# Patient Record
Sex: Female | Born: 1985 | Race: Black or African American | Hispanic: No | Marital: Married | Smoking: Never smoker
Health system: Southern US, Community
[De-identification: ages and names within clinical notes are randomized; demographics above are authoritative.]

## PROBLEM LIST (undated history)

## (undated) DIAGNOSIS — Z789 Other specified health status: Secondary | ICD-10-CM

## (undated) HISTORY — PX: TONSILLECTOMY: SUR1361

---

## 2011-02-01 ENCOUNTER — Emergency Department (HOSPITAL_COMMUNITY)
Admission: EM | Admit: 2011-02-01 | Discharge: 2011-02-01 | Disposition: A | Discharge status: Home / Self Care | Payer: BC Managed Care – PPO | Attending: Emergency Medicine | Admitting: Emergency Medicine

## 2011-02-01 DIAGNOSIS — F411 Generalized anxiety disorder: Secondary | ICD-10-CM | POA: Insufficient documentation

## 2011-02-01 DIAGNOSIS — S335XXA Sprain of ligaments of lumbar spine, initial encounter: Secondary | ICD-10-CM | POA: Insufficient documentation

## 2011-02-01 DIAGNOSIS — M545 Low back pain, unspecified: Secondary | ICD-10-CM | POA: Insufficient documentation

## 2011-02-01 DIAGNOSIS — M25569 Pain in unspecified knee: Secondary | ICD-10-CM | POA: Insufficient documentation

## 2011-02-01 DIAGNOSIS — S8000XA Contusion of unspecified knee, initial encounter: Secondary | ICD-10-CM | POA: Insufficient documentation

## 2013-01-16 ENCOUNTER — Other Ambulatory Visit (INDEPENDENT_AMBULATORY_CARE_PROVIDER_SITE_OTHER): Payer: BC Managed Care – PPO

## 2013-01-16 ENCOUNTER — Encounter (INDEPENDENT_AMBULATORY_CARE_PROVIDER_SITE_OTHER): Payer: BC Managed Care – PPO | Admitting: Obstetrics

## 2013-01-16 DIAGNOSIS — Z3201 Encounter for pregnancy test, result positive: Secondary | ICD-10-CM

## 2013-01-16 NOTE — Progress Notes (Unsigned)
Pt in office for a confirmation of pregnancy. Pt states she had a positive home pregnancy test. Pregnancy test in office is positive. LMP- 12-15-12 with EDD- 09-22-12   Pt advised to schedule a NOB appointment. Pt advised to start PNV. Pt states she will get OTC PNV.

## 2013-02-01 ENCOUNTER — Inpatient Hospital Stay (HOSPITAL_COMMUNITY)
Admission: AD | Admit: 2013-02-01 | Discharge: 2013-02-02 | Disposition: A | Discharge status: Home / Self Care | Payer: BC Managed Care – PPO | Source: Ambulatory Visit | Attending: Obstetrics & Gynecology | Admitting: Obstetrics & Gynecology

## 2013-02-01 ENCOUNTER — Encounter (HOSPITAL_COMMUNITY): Payer: Self-pay

## 2013-02-01 DIAGNOSIS — O200 Threatened abortion: Secondary | ICD-10-CM

## 2013-02-01 DIAGNOSIS — R109 Unspecified abdominal pain: Secondary | ICD-10-CM | POA: Insufficient documentation

## 2013-02-01 HISTORY — DX: Other specified health status: Z78.9

## 2013-02-01 LAB — CBC
HCT: 34.5 % — ABNORMAL LOW (ref 36.0–46.0)
Hemoglobin: 11.8 g/dL — ABNORMAL LOW (ref 12.0–15.0)
MCH: 29 pg (ref 26.0–34.0)
MCHC: 34.2 g/dL (ref 30.0–36.0)
MCV: 84.8 fL (ref 78.0–100.0)

## 2013-02-01 LAB — URINALYSIS, ROUTINE W REFLEX MICROSCOPIC
Glucose, UA: NEGATIVE mg/dL
Ketones, ur: NEGATIVE mg/dL
Protein, ur: NEGATIVE mg/dL

## 2013-02-01 LAB — URINE MICROSCOPIC-ADD ON

## 2013-02-01 NOTE — MAU Note (Signed)
Patient Deborah Valentine presents today with c/o lower cramping abdominal pain and vaginal bleeding x 5days. Patient states bleeding has gone from light spotting to heavy like a period. Patient not wearing pad now. Taken 2 HPT and was positive.

## 2013-02-02 ENCOUNTER — Encounter (HOSPITAL_COMMUNITY): Payer: Self-pay

## 2013-02-02 ENCOUNTER — Inpatient Hospital Stay (HOSPITAL_COMMUNITY): Payer: BC Managed Care – PPO

## 2013-02-02 DIAGNOSIS — O200 Threatened abortion: Secondary | ICD-10-CM

## 2013-02-02 LAB — WET PREP, GENITAL

## 2013-02-02 LAB — GC/CHLAMYDIA PROBE AMP
CT Probe RNA: NEGATIVE
GC Probe RNA: NEGATIVE

## 2013-02-02 NOTE — MAU Provider Note (Signed)
Chief Complaint: Abdominal Pain and Vaginal Bleeding   First Provider Initiated Contact with Patient 02/02/13 0118     SUBJECTIVE HPI: Deborah Valentine is a 27 y.o. G1P0000 at 106w2d by LMP who presents to maternity admissions reporting bright red vaginal bleeding, described as more than spotting. She has been wearing a pad for this.  Patient's last menstrual period was 12/15/2012.  She denies vaginal itching/burning, urinary symptoms, h/a, dizziness, n/v, or fever/chills.     Past Medical History  Diagnosis Date  . Medical history non-contributory    Past Surgical History  Procedure Laterality Date  . Tonsillectomy     History   Social History  . Marital Status: Married    Spouse Name: N/A    Number of Children: N/A  . Years of Education: N/A   Occupational History  . Not on file.   Social History Main Topics  . Smoking status: Never Smoker   . Smokeless tobacco: Not on file  . Alcohol Use: No     Comment: Occasssional Use befor pregnancy  . Drug Use: No  . Sexual Activity: Yes   Other Topics Concern  . Not on file   Social History Narrative  . No narrative on file   No current facility-administered medications on file prior to encounter.   No current outpatient prescriptions on file prior to encounter.   No Known Allergies  ROS: Pertinent items in HPI  OBJECTIVE Blood pressure 141/67, pulse 73, temperature 98 F (36.7 C), temperature source Oral, resp. rate 18, height 5' 8.5" (1.74 m), weight 118.026 kg (260 lb 3.2 oz), last menstrual period 12/15/2012, SpO2 100.00%. GENERAL: Well-developed, well-nourished female in no acute distress.  HEENT: Normocephalic HEART: normal rate RESP: normal effort ABDOMEN: Soft, non-tender EXTREMITIES: Nontender, no edema NEURO: Alert and oriented Pelvic exam: Cervix pink, visually closed, without lesion, moderate amount bright red bleeding without clots, vaginal walls and external genitalia normal Bimanual exam: Cervix  0/long/high, firm, anterior, neg CMT, uterus nontender, nonenlarged, adnexa without tenderness, enlargement, or mass  LAB RESULTS Results for orders placed during the hospital encounter of 02/01/13 (from the past 24 hour(s))  URINALYSIS, ROUTINE W REFLEX MICROSCOPIC     Status: Abnormal   Collection Time    02/01/13 10:16 PM      Result Value Range   Color, Urine YELLOW  YELLOW   APPearance CLEAR  CLEAR   Specific Gravity, Urine 1.025  1.005 - 1.030   pH 6.0  5.0 - 8.0   Glucose, UA NEGATIVE  NEGATIVE mg/dL   Hgb urine dipstick MODERATE (*) NEGATIVE   Bilirubin Urine NEGATIVE  NEGATIVE   Ketones, ur NEGATIVE  NEGATIVE mg/dL   Protein, ur NEGATIVE  NEGATIVE mg/dL   Urobilinogen, UA 0.2  0.0 - 1.0 mg/dL   Nitrite NEGATIVE  NEGATIVE   Leukocytes, UA NEGATIVE  NEGATIVE  URINE MICROSCOPIC-ADD ON     Status: None   Collection Time    02/01/13 10:16 PM      Result Value Range   Squamous Epithelial / LPF RARE  RARE   WBC, UA 0-2  <3 WBC/hpf   RBC / HPF 3-6  <3 RBC/hpf   Bacteria, UA RARE  RARE   Urine-Other MUCOUS PRESENT    POCT PREGNANCY, URINE     Status: Abnormal   Collection Time    02/01/13 11:00 PM      Result Value Range   Preg Test, Ur POSITIVE (*) NEGATIVE  CBC     Status: Abnormal  Collection Time    02/01/13 11:10 PM      Result Value Range   WBC 9.2  4.0 - 10.5 K/uL   RBC 4.07  3.87 - 5.11 MIL/uL   Hemoglobin 11.8 (*) 12.0 - 15.0 g/dL   HCT 16.1 (*) 09.6 - 04.5 %   MCV 84.8  78.0 - 100.0 fL   MCH 29.0  26.0 - 34.0 pg   MCHC 34.2  30.0 - 36.0 g/dL   RDW 40.9  81.1 - 91.4 %   Platelets 264  150 - 400 K/uL  HCG, QUANTITATIVE, PREGNANCY     Status: Abnormal   Collection Time    02/01/13 11:10 PM      Result Value Range   hCG, Beta Chain, Quant, S 1041 (*) <5 mIU/mL  ABO/RH     Status: None   Collection Time    02/01/13 11:10 PM      Result Value Range   ABO/RH(D) A POS      IMAGING US Ob Comp Less 14 Wks  02/02/2013   *RADIOLOGY REPORT*  Clinical Data:  Vaginal bleeding for 5 days.  Pregnant.  Estimated gestational age by LMP is 7 weeks 0 days.  Quantitative beta HCG is 1041.  OBSTETRIC <14 WK [place] AND TRANSVAGINAL OB US  Technique:  Both transabdominal and transvaginal ultrasound examinations were performed for complete evaluation of the gestation as well as the maternal uterus, adnexal regions, and pelvic cul-de-sac.  Transvaginal technique was performed to assess early pregnancy.  Comparison:  None.  Intrauterine gestational sac:  Visualized/normal in shape. Yolk sac: Not visualized Embryo: Not visualized  MSD: 6.4 mm  5 w 2 d  Maternal uterus/adnexae: No subchorionic hemorrhage is seen.  Both maternal ovaries have normal appearances.  No adnexal mass or free pelvic fluid.  IMPRESSION: Probable early intrauterine gestational sac.  No yolk sac or embryo is seen at this time, but not unexpected given the small sac diameter.  Ultrasound follow-up no earlier than 10 days could be considered to confirm progression of intrauterine pregnancy.   Original Report Authenticated By: Britta Mccreedy, M.D.   US Ob Transvaginal  02/02/2013   *RADIOLOGY REPORT*  Clinical Data: Vaginal bleeding for 5 days.  Pregnant.  Estimated gestational age by LMP is 7 weeks 0 days.  Quantitative beta HCG is 1041.  OBSTETRIC <14 WK [place] AND TRANSVAGINAL OB US  Technique:  Both transabdominal and transvaginal ultrasound examinations were performed for complete evaluation of the gestation as well as the maternal uterus, adnexal regions, and pelvic cul-de-sac.  Transvaginal technique was performed to assess early pregnancy.  Comparison:  None.  Intrauterine gestational sac:  Visualized/normal in shape. Yolk sac: Not visualized Embryo: Not visualized  MSD: 6.4 mm  5 w 2 d  Maternal uterus/adnexae: No subchorionic hemorrhage is seen.  Both maternal ovaries have normal appearances.  No adnexal mass or free pelvic fluid.  IMPRESSION: Probable early intrauterine gestational sac.  No yolk sac or embryo is  seen at this time, but not unexpected given the small sac diameter.  Ultrasound follow-up no earlier than 10 days could be considered to confirm progression of intrauterine pregnancy.   Original Report Authenticated By: Britta Mccreedy, M.D.    ASSESSMENT 1. Threatened miscarriage in early pregnancy     PLAN Discharge home with ectopic/bleeding precautions Return to MAU in 48 hours for repeat quant hcg Return sooner as needed    Medication List    ASK your doctor about these medications  prenatal multivitamin Tabs tablet  Take 1 tablet by mouth daily at 12 noon.         Sharen Counter Certified Nurse-Midwife 02/02/2013  1:28 AM

## 2013-02-04 ENCOUNTER — Inpatient Hospital Stay (HOSPITAL_COMMUNITY)
Admission: AD | Admit: 2013-02-04 | Discharge: 2013-02-04 | Disposition: A | Discharge status: Home / Self Care | Payer: BC Managed Care – PPO | Source: Ambulatory Visit | Attending: Obstetrics & Gynecology | Admitting: Obstetrics & Gynecology

## 2013-02-04 DIAGNOSIS — O039 Complete or unspecified spontaneous abortion without complication: Secondary | ICD-10-CM

## 2013-02-04 NOTE — MAU Note (Signed)
Pt states here for blood work. Had bleeding yesterday and noted one large clot. Denies pain at present. Is bleeding light at present, not wearing a pad.

## 2013-02-04 NOTE — Discharge Instructions (Signed)
Miscarriage  An early pregnancy loss or spontaneous abortion (miscarriage) is a common problem. This usually happens when the pregnancy is not developing normally. It is very unlikely that you or your partner did anything to cause this, although cigarette smoking, a sexually transmitted disease, excessive alcohol use, or drug abuse can increase the risk. Other causes are:   Abnormalities of the uterus.   Hormone or medical problems.   Trauma or genetic (chromosome) problems.  Having a miscarriage does not change your chances of having a normal pregnancy in the future. Your caregiver will advise you when it is safe to try to get pregnant again.  AFTER A MISCARRIAGE   A miscarriage is inevitable when there is continual, heavy vaginal bleeding; cramping; dilation of the cervix; or passing of any pregnancy tissue. Bleeding and cramping will usually continue until all the tissue has been removed from the womb (uterus).   Often the uterus does not clean itself out completely. A medication or a D&C procedure is needed to loosen or remove the pregnancy tissue from the uterus. A D&C scrapes or suctions the tissue out.   If you are RH negative, you may need to have Rh immune globulin to avoid Rh problems.   You may be given medication to fight an infection if the miscarriage was due to an infection.  HOME CARE INSTRUCTIONS    You should rest in bed for the next 2 to 3 days.   Do not take tub baths or put anything in your vagina, including tampons or a douche.   Do not have sex until your caregiver approves.   Avoid exercise or heavy activities until directed by your caregiver.   Save any vaginal discharge that looks like tissue. Ask your caregiver if he or she wants to inspect the discharge.   If you and your partner are having problems with guilt or grieving, talk to your caregiver or get counseling to help you understand and cope with your pregnancy loss.   Allow enough time to grieve before trying to get  pregnant again.  SEEK IMMEDIATE MEDICAL CARE IF:    You have persistent heavy bleeding or a bad smelling vaginal discharge.   You have continued abdominal or pelvic pain.   You have an oral temperature above 102 F (38.9 C), not controlled by medicine.   You have severe weakness, fainting, or keep throwing up (vomiting).   You develop chills.   You are experiencing domestic violence.  MAKE SURE YOU:    Understand these instructions.   Will watch your condition.   Will get help right away if you are not doing well or get worse.  Document Released: 06/24/2004 Document Revised: 05/06/2011 Document Reviewed: 05/09/2008  Exit[employer] Patient Information 2012 [employer]

## 2013-02-04 NOTE — MAU Provider Note (Signed)
CC: Follow-up BHCG         HPI Deborah Valentine is a 27 y.o. G1P0000 [redacted]w[redacted]d by LMP Presents for followup quantitative beta-hCG. She was initially seen here on 02/02/2013 with history of cramping and spotting for 5 days and heavy menstrual-like bleeding on 02/02/2013. Since then she's continued bleeding like a period and had heavy bleeding and passage of one large clot versus tissue last night. Following that, the bleeding has tapered to light flow. Denies abdominal pain or cramping.  Quantitative beta-hCG 1041 on 02/02/2013. Wet prep, GC/Chlamydia were negative. Blood type A positive. This pregnancy was undesired, however the couple have decided they would like to attempt pregnancy soon.   Past Medical History  Diagnosis Date  . Medical history non-contributory     OB History  Gravida Para Term Preterm AB SAB TAB Ectopic Multiple Living  1 0 0 0 0 0 0 0 0 0     # Outcome Date GA Lbr Len/2nd Weight Sex Delivery Anes PTL Lv  1 CUR               Past Surgical History  Procedure Laterality Date  . Tonsillectomy      History   Social History  . Marital Status: Married    Spouse Name: N/A    Number of Children: N/A  . Years of Education: N/A   Occupational History  . Not on file.   Social History Main Topics  . Smoking status: Never Smoker   . Smokeless tobacco: Not on file  . Alcohol Use: No     Comment: Occasssional Use befor pregnancy  . Drug Use: No  . Sexual Activity: Yes   Other Topics Concern  . Not on file   Social History Narrative  . No narrative on file    No current facility-administered medications on file prior to encounter.   Current Outpatient Prescriptions on File Prior to Encounter  Medication Sig Dispense Refill  . Prenatal Vit-Fe Fumarate-FA (PRENATAL MULTIVITAMIN) TABS tablet Take 1 tablet by mouth daily at 12 noon.        No Known Allergies  ROS Pertinent items in HPI  PHYSICAL EXAM Filed Vitals:   02/04/13 1000  BP: 137/67  Pulse: 70   Temp: 97.9 F (36.6 C)  Resp: 16   General: Well nourished, well developed female in no acute distress Cardiovascular: Normal rate Respiratory: Normal effort Abdomen: Soft, nontender Back: No CVAT Extremities: No edema Neurologic: Alert and oriented  LAB RESULTS Results for orders placed during the hospital encounter of 02/04/13 (from the past 24 hour(s))  HCG, QUANTITATIVE, PREGNANCY     Status: Abnormal   Collection Time    02/04/13  9:55 AM      Result Value Range   hCG, Beta Chain, Quant, S 175 (*) <5 mIU/mL    IMAGING US Ob Comp Less 14 Wks  02/02/2013   *RADIOLOGY REPORT*  Clinical Data: Vaginal bleeding for 5 days.  Pregnant.  Estimated gestational age by LMP is 7 weeks 0 days.  Quantitative beta HCG is 1041.  OBSTETRIC <14 WK [place] AND TRANSVAGINAL OB US  Technique:  Both transabdominal and transvaginal ultrasound examinations were performed for complete evaluation of the gestation as well as the maternal uterus, adnexal regions, and pelvic cul-de-sac.  Transvaginal technique was performed to assess early pregnancy.  Comparison:  None.  Intrauterine gestational sac:  Visualized/normal in shape. Yolk sac: Not visualized Embryo: Not visualized  MSD: 6.4 mm  5 w 2 d  Maternal uterus/adnexae: No subchorionic hemorrhage is seen.  Both maternal ovaries have normal appearances.  No adnexal mass or free pelvic fluid.  IMPRESSION: Probable early intrauterine gestational sac.  No yolk sac or embryo is seen at this time, but not unexpected given the small sac diameter.  Ultrasound follow-up no earlier than 10 days could be considered to confirm progression of intrauterine pregnancy.   Original Report Authenticated By: Britta Mccreedy, M.D.   US Ob Transvaginal  02/02/2013   *RADIOLOGY REPORT*  Clinical Data: Vaginal bleeding for 5 days.  Pregnant.  Estimated gestational age by LMP is 7 weeks 0 days.  Quantitative beta HCG is 1041.  OBSTETRIC <14 WK [place] AND TRANSVAGINAL OB US  Technique:  Both  transabdominal and transvaginal ultrasound examinations were performed for complete evaluation of the gestation as well as the maternal uterus, adnexal regions, and pelvic cul-de-sac.  Transvaginal technique was performed to assess early pregnancy.  Comparison:  None.  Intrauterine gestational sac:  Visualized/normal in shape. Yolk sac: Not visualized Embryo: Not visualized  MSD: 6.4 mm  5 w 2 d  Maternal uterus/adnexae: No subchorionic hemorrhage is seen.  Both maternal ovaries have normal appearances.  No adnexal mass or free pelvic fluid.  IMPRESSION: Probable early intrauterine gestational sac.  No yolk sac or embryo is seen at this time, but not unexpected given the small sac diameter.  Ultrasound follow-up no earlier than 10 days could be considered to confirm progression of intrauterine pregnancy.   Original Report Authenticated By: Britta Mccreedy, M.D.    MAU COURSE   ASSESSMENT  1. SAB (spontaneous abortion)   G1 at [redacted]w[redacted]d with history and falling quantitative Beta hCG consistent with SAB  PLAN Discharge home With ectopic precautions. See AVS for patient education. Follow-up Information   Follow up with Nursepractioner Mau, NP. (Repeat blood test in 1 wk)    Contact information:   [phone]        Medication List         prenatal multivitamin Tabs tablet  Take 1 tablet by mouth daily at 12 noon.          Danae Orleans, CNM 02/04/2013 10:42 AM

## 2013-02-04 NOTE — MAU Provider Note (Signed)
Attestation of Attending Supervision of Advanced Practitioner (CNM/NP): Evaluation and management procedures were performed by the Advanced Practitioner under my supervision and collaboration.  I have reviewed the Advanced Practitioner's note and chart, and I agree with the management and plan.  HARRAWAY-SMITH, Jase Reep 11:35 AM

## 2013-02-05 ENCOUNTER — Encounter: Payer: BC Managed Care – PPO | Admitting: Obstetrics

## 2013-02-20 ENCOUNTER — Encounter: Payer: BC Managed Care – PPO | Admitting: Obstetrics

## 2013-12-07 ENCOUNTER — Encounter (HOSPITAL_COMMUNITY): Payer: Self-pay | Admitting: *Deleted

## 2014-01-30 ENCOUNTER — Emergency Department (INDEPENDENT_AMBULATORY_CARE_PROVIDER_SITE_OTHER)
Admission: EM | Admit: 2014-01-30 | Discharge: 2014-01-30 | Disposition: A | Discharge status: Home / Self Care | Payer: Self-pay | Source: Home / Self Care

## 2014-01-30 ENCOUNTER — Other Ambulatory Visit (HOSPITAL_COMMUNITY)
Admission: RE | Admit: 2014-01-30 | Discharge: 2014-01-30 | Disposition: A | Discharge status: Home / Self Care | Payer: Self-pay | Source: Ambulatory Visit | Attending: Family Medicine | Admitting: Family Medicine

## 2014-01-30 ENCOUNTER — Encounter (HOSPITAL_COMMUNITY): Payer: Self-pay | Admitting: Emergency Medicine

## 2014-01-30 DIAGNOSIS — O9989 Other specified diseases and conditions complicating pregnancy, childbirth and the puerperium: Secondary | ICD-10-CM

## 2014-01-30 DIAGNOSIS — N760 Acute vaginitis: Secondary | ICD-10-CM | POA: Insufficient documentation

## 2014-01-30 DIAGNOSIS — O26891 Other specified pregnancy related conditions, first trimester: Principal | ICD-10-CM

## 2014-01-30 DIAGNOSIS — Z113 Encounter for screening for infections with a predominantly sexual mode of transmission: Secondary | ICD-10-CM | POA: Insufficient documentation

## 2014-01-30 DIAGNOSIS — N898 Other specified noninflammatory disorders of vagina: Secondary | ICD-10-CM

## 2014-01-30 LAB — POCT URINALYSIS DIP (DEVICE)
Bilirubin Urine: NEGATIVE
GLUCOSE, UA: NEGATIVE mg/dL
Ketones, ur: NEGATIVE mg/dL
Leukocytes, UA: NEGATIVE
NITRITE: NEGATIVE
PROTEIN: NEGATIVE mg/dL
SPECIFIC GRAVITY, URINE: 1.015 (ref 1.005–1.030)
UROBILINOGEN UA: 0.2 mg/dL (ref 0.0–1.0)
pH: 7 (ref 5.0–8.0)

## 2014-01-30 NOTE — ED Notes (Signed)
C/o 1 week duration of vaginal d/c , reportedly has frequent BV infections, and this feels like another one

## 2014-01-30 NOTE — ED Notes (Signed)
Call back number for lab issues verified 

## 2014-01-30 NOTE — ED Provider Notes (Addendum)
CSN: [id number]     Arrival date & time 01/30/14  1241 History   First MD Initiated Contact with Patient 01/30/14 1306     Chief Complaint  Patient presents with  . Vaginitis   (Consider location/radiation/quality/duration/timing/severity/associated sxs/prior Treatment) HPI Comments: 28 year old female states she is approximately [redacted] weeks pregnant. Her complaint is that of mild light pelvic pain associated with a vaginal discharge. She also has mild lower mid back pain. Denies injury or trauma. Denies vaginal bleeding. The symptoms began approximately 3-4 days ago. With the exception of "very light occasional discomfort with urination" denies urinary symptoms.   Past Medical History  Diagnosis Date  . Medical history non-contributory    Past Surgical History  Procedure Laterality Date  . Tonsillectomy     History reviewed. No pertinent family history. History  Substance Use Topics  . Smoking status: Never Smoker   . Smokeless tobacco: Not on file  . Alcohol Use: No     Comment: Occasssional Use befor pregnancy   OB History   Grav Para Term Preterm Abortions TAB SAB Ect Mult Living       Review of Systems  Constitutional: Negative.   Respiratory: Negative.   Cardiovascular: Negative.   Gastrointestinal: Negative.   Genitourinary: Positive for vaginal discharge. Negative for frequency and vaginal bleeding.       As per history of present illness  Musculoskeletal: Positive for back pain.  Skin: Negative.   Neurological: Negative.     Allergies  Review of patient's allergies indicates no known allergies.  Home Medications   Prior to Admission medications   Medication Sig Start Date End Date Taking? Authorizing Provider  Prenatal Vit-Fe Fumarate-FA (PRENATAL MULTIVITAMIN) TABS tablet Take 1 tablet by mouth daily at 12 noon.    Historical Provider, MD   BP 133/83  Pulse 66  Temp(Src) 98.5 F (36.9 C) (Oral)  SpO2 100%  LMP 12/15/2012 Physical  Exam  Nursing note and vitals reviewed. Constitutional: She is oriented to person, place, and time. She appears well-developed and well-nourished. No distress.  Neck: Normal range of motion. Neck supple.  Cardiovascular: Normal rate and regular rhythm.   Pulmonary/Chest: Effort normal. No respiratory distress.  Abdominal: Soft. There is no tenderness.  Genitourinary: Vaginal discharge found.  NEFG: Smooth, creamy, light green discharge with few bubbles. Small to moderate amt. Cx pink, no lesions seen. Minor CMT , unable to reach cervix with digital exam well enough for optimal testing.  Minor bilat adnexal tenderness.   Musculoskeletal: She exhibits no edema.  Neurological: She is alert and oriented to person, place, and time. She exhibits normal muscle tone.  Skin: Skin is warm and dry.  Psychiatric: She has a normal mood and affect.    ED Course  Procedures (including critical care time) Labs Review Labs Reviewed  POCT URINALYSIS DIP (DEVICE) - Abnormal; Notable for the following:    Hgb urine dipstick TRACE (*)    All other components within normal limits  CERVICOVAGINAL ANCILLARY ONLY    Imaging Review No results found. Results for orders placed during the hospital encounter of 01/30/14  POCT URINALYSIS DIP (DEVICE)      Result Value Ref Range   Glucose, UA NEGATIVE  NEGATIVE mg/dL   Bilirubin Urine NEGATIVE  NEGATIVE   Ketones, ur NEGATIVE  NEGATIVE mg/dL   Specific Gravity, Urine 1.015  1.005 - 1.030   Hgb urine dipstick TRACE (*) NEGATIVE   pH 7.0  5.0 - 8.0   Protein, ur NEGATIVE  NEGATIVE mg/dL   Urobilinogen, UA 0.2  0.0 - 1.0 mg/dL   Nitrite NEGATIVE  NEGATIVE   Leukocytes, UA NEGATIVE  NEGATIVE     MDM   1. Vaginal discharge during pregnancy in first trimester     Due to pregnancy will wait for cytology If + for BV treat with clindamycin 300 mg BID for 7 days.     Hayden Rasmussen, NP 01/30/14 1343  Hayden Rasmussen, NP 02/02/14 1259

## 2014-01-30 NOTE — ED Provider Notes (Signed)
Medical screening examination/treatment/procedure(s) were performed by a resident physician or non-physician practitioner and as the supervising physician I was immediately available for consultation/collaboration.  Sharhonda Atwood, MD Family Medicine   Ravis Herne J Arik Husmann, MD 01/30/14 1726 

## 2014-01-31 ENCOUNTER — Telehealth (HOSPITAL_COMMUNITY): Payer: Self-pay | Admitting: *Deleted

## 2014-01-31 NOTE — ED Notes (Addendum)
GC/Chlamydia neg., Affirm: Candida and Trich neg., Gardnerella pos.  Hayden Rasmussen NP gave me a verbal order for Flagyl 500 mg. PO BID #14,  if pt. was pos. for bacterial vaginosis or Trich. He said to send him a message and he will enter the order.  Message sent to [facility].  I called pt. and left a message to call.  Call 1. Vassie Moselle 01/31/2014 Error previous note. Hayden Rasmussen NP wanted pt. to have Clindamycin, because she is pregnant. He printed the Rx. Pt. called in and was notified by Rosalie Gums.  Rx. was called in to [facility] by her. 02/02/2014

## 2014-02-02 MED ORDER — CLINDAMYCIN HCL 300 MG PO CAPS: ORAL_CAPSULE | ORAL | Status: DC

## 2014-02-02 NOTE — ED Provider Notes (Signed)
Medical screening examination/treatment/procedure(s) were performed by a resident physician or non-physician practitioner and as the supervising physician I was immediately available for consultation/collaboration.  Shelly Flatten, MD Family Medicine   Ozella Rocks, MD 02/02/14 [phone]

## 2014-02-02 NOTE — ED Notes (Addendum)
Patient returned call.  Patient made aware of lab results and requested medication be called into [facility].  Chart reviewed by Hayden Rasmussen NP.  He is requesting that RX be changed to Clindamycin 300 mg PO BID qty 14 as written in his original chart.  Pt called and made aware.

## 2014-02-28 ENCOUNTER — Other Ambulatory Visit (HOSPITAL_COMMUNITY): Payer: Self-pay | Admitting: Physician Assistant

## 2014-02-28 DIAGNOSIS — Z3682 Encounter for antenatal screening for nuchal translucency: Secondary | ICD-10-CM

## 2014-02-28 LAB — OB RESULTS CONSOLE HEPATITIS B SURFACE ANTIGEN: Hepatitis B Surface Ag: NEGATIVE

## 2014-02-28 LAB — OB RESULTS CONSOLE ABO/RH: RH TYPE: POSITIVE

## 2014-02-28 LAB — OB RESULTS CONSOLE GC/CHLAMYDIA
CHLAMYDIA, DNA PROBE: NEGATIVE
Gonorrhea: NEGATIVE

## 2014-02-28 LAB — OB RESULTS CONSOLE ANTIBODY SCREEN: Antibody Screen: NEGATIVE

## 2014-02-28 LAB — OB RESULTS CONSOLE RUBELLA ANTIBODY, IGM: RUBELLA: IMMUNE

## 2014-02-28 LAB — OB RESULTS CONSOLE HIV ANTIBODY (ROUTINE TESTING): HIV: NONREACTIVE

## 2014-02-28 LAB — OB RESULTS CONSOLE RPR: RPR: NONREACTIVE

## 2014-03-05 ENCOUNTER — Ambulatory Visit (HOSPITAL_COMMUNITY): Admission: RE | Admit: 2014-03-05 | Payer: Self-pay | Source: Ambulatory Visit

## 2014-03-05 ENCOUNTER — Other Ambulatory Visit (HOSPITAL_COMMUNITY): Payer: Self-pay

## 2014-03-29 ENCOUNTER — Other Ambulatory Visit (HOSPITAL_COMMUNITY): Payer: Self-pay | Admitting: Nurse Practitioner

## 2014-03-29 DIAGNOSIS — Z3689 Encounter for other specified antenatal screening: Secondary | ICD-10-CM

## 2014-04-01 ENCOUNTER — Encounter (HOSPITAL_COMMUNITY): Payer: Self-pay | Admitting: Emergency Medicine

## 2014-04-22 ENCOUNTER — Ambulatory Visit (HOSPITAL_COMMUNITY): Admission: RE | Admit: 2014-04-22 | Payer: Self-pay | Source: Ambulatory Visit

## 2014-05-10 ENCOUNTER — Other Ambulatory Visit: Payer: Self-pay | Admitting: Obstetrics and Gynecology

## 2014-05-10 ENCOUNTER — Ambulatory Visit (HOSPITAL_COMMUNITY)
Admission: RE | Admit: 2014-05-10 | Discharge: 2014-05-10 | Disposition: A | Discharge status: Home / Self Care | Payer: Medicaid Other | Source: Ambulatory Visit | Attending: Nurse Practitioner | Admitting: Nurse Practitioner

## 2014-05-10 ENCOUNTER — Other Ambulatory Visit (HOSPITAL_COMMUNITY): Payer: Self-pay | Admitting: Nurse Practitioner

## 2014-05-10 DIAGNOSIS — Z36 Encounter for antenatal screening of mother: Secondary | ICD-10-CM | POA: Diagnosis not present

## 2014-05-10 DIAGNOSIS — Z3A22 22 weeks gestation of pregnancy: Secondary | ICD-10-CM | POA: Insufficient documentation

## 2014-05-10 DIAGNOSIS — O26879 Cervical shortening, unspecified trimester: Secondary | ICD-10-CM | POA: Insufficient documentation

## 2014-05-10 DIAGNOSIS — Z3689 Encounter for other specified antenatal screening: Secondary | ICD-10-CM | POA: Insufficient documentation

## 2014-05-10 DIAGNOSIS — O26872 Cervical shortening, second trimester: Secondary | ICD-10-CM | POA: Insufficient documentation

## 2014-05-10 MED ORDER — PROGESTERONE MICRONIZED 200 MG PO CAPS: ORAL_CAPSULE | ORAL | Status: DC

## 2014-05-17 ENCOUNTER — Other Ambulatory Visit (HOSPITAL_COMMUNITY): Payer: Self-pay | Admitting: Nurse Practitioner

## 2014-05-17 DIAGNOSIS — Z3689 Encounter for other specified antenatal screening: Secondary | ICD-10-CM

## 2014-05-17 DIAGNOSIS — Z0374 Encounter for suspected problem with fetal growth ruled out: Secondary | ICD-10-CM

## 2014-05-31 NOTE — L&D Delivery Note (Signed)
Patient is 29 y.o. G2P0010 2534w4d admitted SOL, hx of preeclampsia   Delivery Note At 7:11 PM a viable female Deborah Valentine(Victoria Elizabeth) was delivered via Vaginal, Spontaneous Delivery (Presentation: Occiput Anterior).  APGAR: 9, 9; weight  pending.  Placenta status: intact.  Cord: 3 vessel with the following complications: none .    Anesthesia: Epidural  Episiotomy: None Lacerations: 1st degree Suture Repair: 3.0 Est. Blood Loss (mL): 250cc  Mom to postpartum.  Baby to Couplet care / Skin to Skin.  Delynn FlavinGottschalk, Mikal Blasdell M, DO 09/10/2014, 7:50 PM

## 2014-06-07 ENCOUNTER — Ambulatory Visit (HOSPITAL_COMMUNITY): Payer: Medicaid Other

## 2014-06-11 ENCOUNTER — Ambulatory Visit (HOSPITAL_COMMUNITY)
Admission: RE | Admit: 2014-06-11 | Discharge: 2014-06-11 | Disposition: A | Discharge status: Home / Self Care | Payer: Medicaid Other | Source: Ambulatory Visit | Attending: Nurse Practitioner | Admitting: Nurse Practitioner

## 2014-06-11 DIAGNOSIS — Z3689 Encounter for other specified antenatal screening: Secondary | ICD-10-CM

## 2014-06-11 DIAGNOSIS — Z3A26 26 weeks gestation of pregnancy: Secondary | ICD-10-CM | POA: Insufficient documentation

## 2014-06-11 DIAGNOSIS — O26872 Cervical shortening, second trimester: Secondary | ICD-10-CM | POA: Insufficient documentation

## 2014-06-11 DIAGNOSIS — Z36 Encounter for antenatal screening of mother: Secondary | ICD-10-CM | POA: Insufficient documentation

## 2014-06-11 DIAGNOSIS — O99213 Obesity complicating pregnancy, third trimester: Secondary | ICD-10-CM | POA: Insufficient documentation

## 2014-06-11 DIAGNOSIS — O99212 Obesity complicating pregnancy, second trimester: Secondary | ICD-10-CM | POA: Insufficient documentation

## 2014-06-11 DIAGNOSIS — Z0374 Encounter for suspected problem with fetal growth ruled out: Secondary | ICD-10-CM | POA: Insufficient documentation

## 2014-06-28 ENCOUNTER — Other Ambulatory Visit (HOSPITAL_COMMUNITY): Payer: Self-pay | Admitting: Urology

## 2014-06-28 DIAGNOSIS — IMO0002 Reserved for concepts with insufficient information to code with codable children: Secondary | ICD-10-CM

## 2014-06-28 DIAGNOSIS — Z0489 Encounter for examination and observation for other specified reasons: Secondary | ICD-10-CM

## 2014-07-29 ENCOUNTER — Ambulatory Visit (HOSPITAL_COMMUNITY)
Admission: RE | Admit: 2014-07-29 | Discharge: 2014-07-29 | Disposition: A | Discharge status: Home / Self Care | Payer: Medicaid Other | Source: Ambulatory Visit | Attending: Urology | Admitting: Urology

## 2014-07-29 DIAGNOSIS — Z3A33 33 weeks gestation of pregnancy: Secondary | ICD-10-CM | POA: Insufficient documentation

## 2014-07-29 DIAGNOSIS — O99213 Obesity complicating pregnancy, third trimester: Secondary | ICD-10-CM | POA: Insufficient documentation

## 2014-07-29 DIAGNOSIS — IMO0002 Reserved for concepts with insufficient information to code with codable children: Secondary | ICD-10-CM | POA: Insufficient documentation

## 2014-07-29 DIAGNOSIS — Z36 Encounter for antenatal screening of mother: Secondary | ICD-10-CM | POA: Insufficient documentation

## 2014-07-29 DIAGNOSIS — Z0489 Encounter for examination and observation for other specified reasons: Secondary | ICD-10-CM | POA: Insufficient documentation

## 2014-08-21 LAB — OB RESULTS CONSOLE GBS: STREP GROUP B AG: POSITIVE

## 2014-09-09 ENCOUNTER — Inpatient Hospital Stay (HOSPITAL_COMMUNITY)
Admission: AD | Admit: 2014-09-09 | Discharge: 2014-09-12 | DRG: 774 | Disposition: A | Discharge status: Home / Self Care | Payer: BC Managed Care – PPO | Source: Ambulatory Visit | Attending: Family Medicine | Admitting: Family Medicine

## 2014-09-09 ENCOUNTER — Encounter (HOSPITAL_COMMUNITY): Payer: Self-pay | Admitting: Family Medicine

## 2014-09-09 DIAGNOSIS — O1490 Unspecified pre-eclampsia, unspecified trimester: Secondary | ICD-10-CM | POA: Diagnosis present

## 2014-09-09 DIAGNOSIS — O99824 Streptococcus B carrier state complicating childbirth: Secondary | ICD-10-CM | POA: Diagnosis present

## 2014-09-09 DIAGNOSIS — O1092 Unspecified pre-existing hypertension complicating childbirth: Secondary | ICD-10-CM | POA: Diagnosis not present

## 2014-09-09 DIAGNOSIS — O1413 Severe pre-eclampsia, third trimester: Secondary | ICD-10-CM

## 2014-09-09 DIAGNOSIS — Z3A39 39 weeks gestation of pregnancy: Secondary | ICD-10-CM | POA: Diagnosis present

## 2014-09-09 DIAGNOSIS — O113 Pre-existing hypertension with pre-eclampsia, third trimester: Principal | ICD-10-CM | POA: Diagnosis present

## 2014-09-09 NOTE — MAU Note (Signed)
Pt reports what she thinks is contractions q 7-8 minutes.

## 2014-09-10 ENCOUNTER — Inpatient Hospital Stay (HOSPITAL_COMMUNITY): Payer: BC Managed Care – PPO | Admitting: Anesthesiology

## 2014-09-10 ENCOUNTER — Encounter (HOSPITAL_COMMUNITY): Payer: Self-pay | Admitting: *Deleted

## 2014-09-10 DIAGNOSIS — O113 Pre-existing hypertension with pre-eclampsia, third trimester: Secondary | ICD-10-CM | POA: Diagnosis not present

## 2014-09-10 DIAGNOSIS — O1092 Unspecified pre-existing hypertension complicating childbirth: Secondary | ICD-10-CM | POA: Diagnosis not present

## 2014-09-10 DIAGNOSIS — O1490 Unspecified pre-eclampsia, unspecified trimester: Secondary | ICD-10-CM | POA: Diagnosis present

## 2014-09-10 DIAGNOSIS — N858 Other specified noninflammatory disorders of uterus: Secondary | ICD-10-CM | POA: Diagnosis present

## 2014-09-10 DIAGNOSIS — O99824 Streptococcus B carrier state complicating childbirth: Secondary | ICD-10-CM | POA: Diagnosis not present

## 2014-09-10 DIAGNOSIS — Z3A39 39 weeks gestation of pregnancy: Secondary | ICD-10-CM | POA: Diagnosis present

## 2014-09-10 LAB — CBC
HCT: 33.8 % — ABNORMAL LOW (ref 36.0–46.0)
HCT: 36 % (ref 36.0–46.0)
Hemoglobin: 11.6 g/dL — ABNORMAL LOW (ref 12.0–15.0)
Hemoglobin: 12.3 g/dL (ref 12.0–15.0)
MCH: 29.1 pg (ref 26.0–34.0)
MCH: 29.2 pg (ref 26.0–34.0)
MCHC: 34.2 g/dL (ref 30.0–36.0)
MCHC: 34.3 g/dL (ref 30.0–36.0)
MCV: 84.9 fL (ref 78.0–100.0)
MCV: 85.5 fL (ref 78.0–100.0)
PLATELETS: 234 10*3/uL (ref 150–400)
Platelets: 225 10*3/uL (ref 150–400)
RBC: 3.98 MIL/uL (ref 3.87–5.11)
RBC: 4.21 MIL/uL (ref 3.87–5.11)
RDW: 13.4 % (ref 11.5–15.5)
RDW: 13.6 % (ref 11.5–15.5)
WBC: 14.4 10*3/uL — ABNORMAL HIGH (ref 4.0–10.5)
WBC: 7.7 10*3/uL (ref 4.0–10.5)

## 2014-09-10 LAB — COMPREHENSIVE METABOLIC PANEL
ALBUMIN: 2.7 g/dL — AB (ref 3.5–5.2)
ALK PHOS: 165 U/L — AB (ref 39–117)
ALT: 15 U/L (ref 0–35)
ANION GAP: 6 (ref 5–15)
AST: 16 U/L (ref 0–37)
BILIRUBIN TOTAL: 0.2 mg/dL — AB (ref 0.3–1.2)
BUN: 7 mg/dL (ref 6–23)
CO2: 23 mmol/L (ref 19–32)
Calcium: 8.9 mg/dL (ref 8.4–10.5)
Chloride: 107 mmol/L (ref 96–112)
Creatinine, Ser: 0.59 mg/dL (ref 0.50–1.10)
GFR calc Af Amer: >90 mL/min (ref >90)
GFR calc non Af Amer: >90 mL/min (ref >90)
Glucose, Bld: 86 mg/dL (ref 70–99)
POTASSIUM: 3.7 mmol/L (ref 3.5–5.1)
SODIUM: 136 mmol/L (ref 135–145)
Total Protein: 6.6 g/dL (ref 6.0–8.3)

## 2014-09-10 LAB — PROTEIN / CREATININE RATIO, URINE: CREATININE, URINE: 34 mg/dL

## 2014-09-10 LAB — LACTATE DEHYDROGENASE: LDH: 145 U/L (ref 94–250)

## 2014-09-10 LAB — RPR: RPR: NONREACTIVE

## 2014-09-10 MED ORDER — EPHEDRINE 5 MG/ML INJ
10.0000 mg | INTRAVENOUS | Status: DC | PRN
  Filled 2014-09-10: qty 2

## 2014-09-10 MED ORDER — OXYTOCIN 40 UNITS IN LACTATED RINGERS INFUSION - SIMPLE MED
1.0000 m[IU]/min | INTRAVENOUS | Status: DC
  Administered 2014-09-10: 2 m[IU]/min via INTRAVENOUS

## 2014-09-10 MED ORDER — OXYCODONE-ACETAMINOPHEN 5-325 MG PO TABS: 2.0000 | ORAL_TABLET | ORAL | Status: DC | PRN

## 2014-09-10 MED ORDER — ZOLPIDEM TARTRATE 5 MG PO TABS: 5.0000 mg | ORAL_TABLET | Freq: Every evening | ORAL | Status: DC | PRN

## 2014-09-10 MED ORDER — PHENYLEPHRINE 40 MCG/ML (10ML) SYRINGE FOR IV PUSH (FOR BLOOD PRESSURE SUPPORT)
80.0000 ug | PREFILLED_SYRINGE | INTRAVENOUS | Status: DC | PRN
  Filled 2014-09-10: qty 2

## 2014-09-10 MED ORDER — LACTATED RINGERS IV SOLN: 500.0000 mL | Freq: Once | INTRAVENOUS | Status: DC

## 2014-09-10 MED ORDER — TETANUS-DIPHTH-ACELL PERTUSSIS 5-2.5-18.5 LF-MCG/0.5 IM SUSP: 0.5000 mL | Freq: Once | INTRAMUSCULAR | Status: DC

## 2014-09-10 MED ORDER — FENTANYL 2.5 MCG/ML BUPIVACAINE 1/10 % EPIDURAL INFUSION (WH - ANES): 14.0000 mL/h | INTRAMUSCULAR | Status: DC | PRN

## 2014-09-10 MED ORDER — SIMETHICONE 80 MG PO CHEW: 80.0000 mg | CHEWABLE_TABLET | ORAL | Status: DC | PRN

## 2014-09-10 MED ORDER — PHENYLEPHRINE 40 MCG/ML (10ML) SYRINGE FOR IV PUSH (FOR BLOOD PRESSURE SUPPORT)
80.0000 ug | PREFILLED_SYRINGE | INTRAVENOUS | Status: DC | PRN
  Filled 2014-09-10: qty 2
  Filled 2014-09-10: qty 20

## 2014-09-10 MED ORDER — PENICILLIN G POTASSIUM 5000000 UNITS IJ SOLR
2.5000 10*6.[IU] | INTRAVENOUS | Status: DC
  Administered 2014-09-10 (×3): 2.5 10*6.[IU] via INTRAVENOUS
  Filled 2014-09-10 (×6): qty 2.5

## 2014-09-10 MED ORDER — OXYCODONE-ACETAMINOPHEN 5-325 MG PO TABS: 1.0000 | ORAL_TABLET | ORAL | Status: DC | PRN

## 2014-09-10 MED ORDER — ONDANSETRON HCL 4 MG/2ML IJ SOLN: 4.0000 mg | INTRAMUSCULAR | Status: DC | PRN

## 2014-09-10 MED ORDER — OXYTOCIN BOLUS FROM INFUSION
500.0000 mL | INTRAVENOUS | Status: DC
  Administered 2014-09-10: 500 mL via INTRAVENOUS

## 2014-09-10 MED ORDER — DIPHENHYDRAMINE HCL 50 MG/ML IJ SOLN: 12.5000 mg | INTRAMUSCULAR | Status: DC | PRN

## 2014-09-10 MED ORDER — MAGNESIUM SULFATE 40 G IN LACTATED RINGERS - SIMPLE
2.0000 g/h | INTRAVENOUS | Status: DC
  Administered 2014-09-10: 2 g/h via INTRAVENOUS
  Filled 2014-09-10: qty 500

## 2014-09-10 MED ORDER — LABETALOL HCL 5 MG/ML IV SOLN: 10.0000 mg | INTRAVENOUS | Status: DC | PRN

## 2014-09-10 MED ORDER — OXYTOCIN 40 UNITS IN LACTATED RINGERS INFUSION - SIMPLE MED
62.5000 mL/h | INTRAVENOUS | Status: DC
  Filled 2014-09-10: qty 1000

## 2014-09-10 MED ORDER — LABETALOL HCL 5 MG/ML IV SOLN
10.0000 mg | INTRAVENOUS | Status: DC | PRN
Start: 2014-09-10 — End: 2014-09-10

## 2014-09-10 MED ORDER — PHENYLEPHRINE 40 MCG/ML (10ML) SYRINGE FOR IV PUSH (FOR BLOOD PRESSURE SUPPORT): 80.0000 ug | PREFILLED_SYRINGE | INTRAVENOUS | Status: DC | PRN

## 2014-09-10 MED ORDER — MAGNESIUM SULFATE BOLUS VIA INFUSION
4.0000 g | Freq: Once | INTRAVENOUS | Status: AC
  Administered 2014-09-10: 4 g via INTRAVENOUS
  Filled 2014-09-10: qty 500

## 2014-09-10 MED ORDER — WITCH HAZEL-GLYCERIN EX PADS: 1.0000 "application " | MEDICATED_PAD | CUTANEOUS | Status: DC | PRN

## 2014-09-10 MED ORDER — DIBUCAINE 1 % RE OINT: 1.0000 "application " | TOPICAL_OINTMENT | RECTAL | Status: DC | PRN

## 2014-09-10 MED ORDER — BENZOCAINE-MENTHOL 20-0.5 % EX AERO
1.0000 "application " | INHALATION_SPRAY | CUTANEOUS | Status: DC | PRN
  Administered 2014-09-11: 1 via TOPICAL
  Filled 2014-09-10: qty 56

## 2014-09-10 MED ORDER — DIPHENHYDRAMINE HCL 25 MG PO CAPS: 25.0000 mg | ORAL_CAPSULE | Freq: Four times a day (QID) | ORAL | Status: DC | PRN

## 2014-09-10 MED ORDER — IBUPROFEN 600 MG PO TABS
600.0000 mg | ORAL_TABLET | Freq: Four times a day (QID) | ORAL | Status: DC
  Administered 2014-09-10 – 2014-09-12 (×7): 600 mg via ORAL
  Filled 2014-09-10 (×7): qty 1

## 2014-09-10 MED ORDER — PENICILLIN G POTASSIUM 5000000 UNITS IJ SOLR
5.0000 10*6.[IU] | Freq: Once | INTRAVENOUS | Status: AC
  Administered 2014-09-10: 5 10*6.[IU] via INTRAVENOUS
  Filled 2014-09-10: qty 5

## 2014-09-10 MED ORDER — LIDOCAINE HCL (PF) 1 % IJ SOLN
INTRAMUSCULAR | Status: DC | PRN
Start: 2014-09-10 — End: 2014-09-11
  Administered 2014-09-10 (×2): 4 mL

## 2014-09-10 MED ORDER — ONDANSETRON HCL 4 MG PO TABS: 4.0000 mg | ORAL_TABLET | ORAL | Status: DC | PRN

## 2014-09-10 MED ORDER — CITRIC ACID-SODIUM CITRATE 334-500 MG/5ML PO SOLN
30.0000 mL | ORAL | Status: DC | PRN
  Filled 2014-09-10: qty 30

## 2014-09-10 MED ORDER — LIDOCAINE HCL (PF) 1 % IJ SOLN
30.0000 mL | INTRAMUSCULAR | Status: DC | PRN
  Filled 2014-09-10: qty 30

## 2014-09-10 MED ORDER — LACTATED RINGERS IV SOLN: 500.0000 mL | INTRAVENOUS | Status: DC | PRN

## 2014-09-10 MED ORDER — FENTANYL 2.5 MCG/ML BUPIVACAINE 1/10 % EPIDURAL INFUSION (WH - ANES)
INTRAMUSCULAR | Status: DC | PRN
  Administered 2014-09-10: 14 mL/h via EPIDURAL

## 2014-09-10 MED ORDER — EPHEDRINE 5 MG/ML INJ: 10.0000 mg | INTRAVENOUS | Status: DC | PRN

## 2014-09-10 MED ORDER — ACETAMINOPHEN 325 MG PO TABS: 650.0000 mg | ORAL_TABLET | ORAL | Status: DC | PRN

## 2014-09-10 MED ORDER — LANOLIN HYDROUS EX OINT: TOPICAL_OINTMENT | CUTANEOUS | Status: DC | PRN

## 2014-09-10 MED ORDER — TERBUTALINE SULFATE 1 MG/ML IJ SOLN: 0.2500 mg | Freq: Once | INTRAMUSCULAR | Status: DC | PRN

## 2014-09-10 MED ORDER — FENTANYL 2.5 MCG/ML BUPIVACAINE 1/10 % EPIDURAL INFUSION (WH - ANES)
14.0000 mL/h | INTRAMUSCULAR | Status: DC | PRN
  Administered 2014-09-10 (×2): 14 mL/h via EPIDURAL
  Filled 2014-09-10 (×2): qty 125

## 2014-09-10 MED ORDER — OXYCODONE-ACETAMINOPHEN 5-325 MG PO TABS
1.0000 | ORAL_TABLET | ORAL | Status: DC | PRN
Start: 2014-09-10 — End: 2014-09-10

## 2014-09-10 MED ORDER — SENNOSIDES-DOCUSATE SODIUM 8.6-50 MG PO TABS
2.0000 | ORAL_TABLET | ORAL | Status: DC
  Administered 2014-09-10 – 2014-09-11 (×2): 2 via ORAL
  Filled 2014-09-10 (×2): qty 2

## 2014-09-10 MED ORDER — ONDANSETRON HCL 4 MG/2ML IJ SOLN: 4.0000 mg | Freq: Four times a day (QID) | INTRAMUSCULAR | Status: DC | PRN

## 2014-09-10 MED ORDER — PRENATAL MULTIVITAMIN CH
1.0000 | ORAL_TABLET | Freq: Every day | ORAL | Status: DC
  Administered 2014-09-11 – 2014-09-12 (×2): 1 via ORAL
  Filled 2014-09-10 (×2): qty 1

## 2014-09-10 MED ORDER — LACTATED RINGERS IV SOLN
INTRAVENOUS | Status: DC
  Administered 2014-09-10 (×2): via INTRAVENOUS

## 2014-09-10 NOTE — Progress Notes (Signed)
Deborah Valentine is a 29 y.o. G2P0010 at 8148w4d by LMP admitted for induction of labor due to SOL/ preeclampsia.  Subjective: Denies sensation of contractions.  Reports some lower back pressure.  Objective: BP 143/88 mmHg  Pulse 62  Temp(Src) 97.7 F (36.5 C) (Oral)  Resp 18  Ht 5' 8.5" (1.74 m)  Wt 274 lb (124.286 kg)  BMI 41.05 kg/m2  SpO2 100% I/O last 3 completed shifts: In: 240 [P.O.:240] Out: 1200 [Urine:1200] Total I/O In: 2043 [P.O.:600; I.V.:1243; IV Piggyback:200] Out: 900 [Urine:900]  FHT:  FHR: 120 bpm, variability: moderate,  accelerations:  Present,  decelerations:  Present variable/early UC:   regular, every 1-2 minutes SVE:   Dilation: 6.5 Effacement (%): 90 Station: -1 Exam by:: hk  Labs: Lab Results  Component Value Date   WBC 7.7 09/10/2014   HGB 11.6* 09/10/2014   HCT 33.8* 09/10/2014   MCV 84.9 09/10/2014   PLT 225 09/10/2014    Assessment / Plan: Induction of labor due to preeclampsia,  progressing well on pitocin  Labor: Progressing normally Preeclampsia:  on magnesium sulfate and no signs or symptoms of toxicity Fetal Wellbeing:  Category I Pain Control:  Epidural I/D:  GBS+, PCN Anticipated MOD:  NSVD  Raliegh IpGottschalk, Ashly M, DO 09/10/2014, 1:06 PM

## 2014-09-10 NOTE — Progress Notes (Signed)
Deborah Valentine is a 29 y.o. G2P0010 at 4538w4d admitted for active labor, and new pre-eclampsia w/ severe features.  Subjective: Feeling fine, contractions painful but manageable  Objective: BP 145/79 mmHg  Pulse 66  Temp(Src) 98.6 F (37 C) (Oral)  Resp 20  Ht 5' 8.5" (1.74 m)  Wt 124.286 kg (274 lb)  BMI 41.05 kg/m2  SpO2 100%     FHT:  FHR: 130 bpm, variability: moderate,  accelerations:  Present,  decelerations:  Absent UC:   irregular, every 3-5 minutes SVE:   Dilation: 2 Effacement (%): 80 Station: -3 Exam by:: Dr Deborah Valentine  Labs: Lab Results  Component Value Date   WBC 7.7 09/10/2014   HGB 11.6* 09/10/2014   HCT 33.8* 09/10/2014   MCV 84.9 09/10/2014   PLT 225 09/10/2014    Assessment / Plan: Spontaneous labor minimal progress since admission and patient with new pre-eclampsia with severe features.  Labor: No further dilation in 2 hours and patient on mag, will start pitocin Preeclampsia:  on magnesium sulfate, no signs or symptoms of toxicity, intake and ouput balanced and labs stable Fetal Wellbeing:  Category I Pain Control:  Labor support without medications I/D:  GBS pos on PCN Anticipated MOD:  NSVD  Deborah Valentine, Deborah Valentine 09/10/2014, 3:22 AM

## 2014-09-10 NOTE — Progress Notes (Signed)
Deborah SealJennifer Valentine is a 29 y.o. G2P0010 at 6216w4d by LMP admitted for SOL/ preeclampsia.  Subjective: Patient reports that pain is well controlled by epidural.  She reports pressure sensation.  Denies headache, vision changes, abdominal pain, weakness.  Denies any concerns at this time.  Objective: BP 144/89 mmHg  Pulse 76  Temp(Src) 97.7 F (36.5 C) (Oral)  Resp 18  Ht 5' 8.5" (1.74 m)  Wt 274 lb (124.286 kg)  BMI 41.05 kg/m2  SpO2 100% I/O last 3 completed shifts: In: 240 [P.O.:240] Out: 1200 [Urine:1200] Total I/O In: 1388 [P.O.:420; I.V.:868; IV Piggyback:100] Out: -   Gen: awake, alert, well appearing female Cardio: RRR, no m/r/g Pulm: CTAB, no increased WOB GI: NT, +BS Neuro: 2/4 patellar DTRs Ext: WWP, no edema, +2DP  FHT:  FHR: 120 bpm, variability: moderate,  accelerations:  Present,  decelerations:  Absent UC:   regular, every 2-3 minutes SVE:   Dilation: 6 Effacement (%): 90 Station: -1 Exam by:: hk  Labs: Lab Results  Component Value Date   WBC 7.7 09/10/2014   HGB 11.6* 09/10/2014   HCT 33.8* 09/10/2014   MCV 84.9 09/10/2014   PLT 225 09/10/2014    Assessment / Plan: Induction of labor due to preeclampsia,  progressing well on pitocin  Labor: AROM Preeclampsia:  on magnesium sulfate and no signs or symptoms of toxicity Fetal Wellbeing:  Category I Pain Control:  Epidural I/D:  GBS+, PCN Anticipated MOD:  NSVD  Raliegh IpGottschalk, Quetzali Heinle M, DO 09/10/2014, 9:45 AM

## 2014-09-10 NOTE — Anesthesia Procedure Notes (Signed)
Epidural Patient location during procedure: OB Start time: 09/10/2014 7:08 AM  Staffing Anesthesiologist: Mal AmabileFOSTER, Darlisha Kelm Performed by: anesthesiologist   Preanesthetic Checklist Completed: patient identified, site marked, surgical consent, pre-op evaluation, timeout performed, IV checked, risks and benefits discussed and monitors and equipment checked  Epidural Patient position: sitting Prep: site prepped and draped and DuraPrep Patient monitoring: continuous pulse ox and blood pressure Approach: midline Location: L4-L5 Injection technique: LOR air  Needle:  Needle type: Tuohy  Needle gauge: 17 G Needle length: 9 cm and 9 Needle insertion depth: 8 cm Catheter type: closed end flexible Catheter size: 19 Gauge Catheter at skin depth: 13 cm Test dose: negative and Other  Assessment Events: blood not aspirated, injection not painful, no injection resistance, negative IV test and no paresthesia  Additional Notes Patient identified. Risks and benefits discussed including failed block, incomplete  Pain control, post dural puncture headache, nerve damage, paralysis, blood pressure Changes, nausea, vomiting, reactions to medications-both toxic and allergic and post Partum back pain. All questions were answered. Patient expressed understanding and wished to proceed. Sterile technique was used throughout procedure. Epidural site was Dressed with sterile barrier dressing. No paresthesias, signs of intravascular injection Or signs of intrathecal spread were encountered. Difficult due to poor patient positioning and MO. Attempts x 3 Patient was more comfortable after the epidural was dosed. Please see RN's note for documentation of vital signs and FHR which are stable.

## 2014-09-10 NOTE — Anesthesia Preprocedure Evaluation (Signed)
Anesthesia Evaluation  Patient identified by MRN, date of birth, ID band Patient awake    Reviewed: Allergy & Precautions, Patient's Chart, lab work & pertinent test results  Airway Mallampati: III  TM Distance: >3 FB Neck ROM: Full    Dental no notable dental hx. (+) Teeth Intact   Pulmonary neg pulmonary ROS,  breath sounds clear to auscultation  Pulmonary exam normal       Cardiovascular hypertension, Pt. on medications Rhythm:Regular Rate:Normal  Pre eclampsia   Neuro/Psych negative neurological ROS  negative psych ROS   GI/Hepatic Neg liver ROS, GERD-  Medicated and Controlled,  Endo/Other  Morbid obesity  Renal/GU negative Renal ROS  negative genitourinary   Musculoskeletal negative musculoskeletal ROS (+)   Abdominal (+) + obese,   Peds  Hematology  (+) anemia ,   Anesthesia Other Findings   Reproductive/Obstetrics (+) Pregnancy Pre eclampsia                             Anesthesia Physical Anesthesia Plan  ASA: III  Anesthesia Plan: Epidural   Post-op Pain Management:    Induction:   Airway Management Planned: Natural Airway  Additional Equipment:   Intra-op Plan:   Post-operative Plan:   Informed Consent: I have reviewed the patients History and Physical, chart, labs and discussed the procedure including the risks, benefits and alternatives for the proposed anesthesia with the patient or authorized representative who has indicated his/her understanding and acceptance.   Dental advisory given  Plan Discussed with: Anesthesiologist  Anesthesia Plan Comments:         Anesthesia Quick Evaluation

## 2014-09-10 NOTE — H&P (Cosign Needed)
Deborah Valentine is a 29 y.o. female presenting for contractions and loss of mucus plug. Pt reports contractions started around 8pm and have been getting stronger since. They were every 7-8 minutes at home and not severe. Denies bleeding, LOF but did have a single episode of mucousy discharge earlier this evening. Normal FM. Endorses mild headache. Denies RUQ.   Maternal Medical History:  Reason for admission: Contractions.   Contractions: Onset was 3-5 hours ago.   Frequency: regular.   Perceived severity is moderate.    Fetal activity: Perceived fetal activity is normal.   Last perceived fetal movement was within the past hour.    Prenatal complications: No bleeding.   Short cervix  Prenatal Complications - Diabetes: none.    OB History    Gravida Para Term Preterm AB TAB SAB Ectopic Multiple Living       Past Medical History  Diagnosis Date  . Medical history non-contributory    Past Surgical History  Procedure Laterality Date  . Tonsillectomy     Family History: family history is not on file. Social History:  reports that she has never smoked. She does not have any smokeless tobacco history on file. She reports that she does not drink alcohol or use illicit drugs.   Prenatal Transfer Tool  Maternal Diabetes: No Genetic Screening: Normal Maternal Ultrasounds/Referrals: Normal except for short cervix Fetal Ultrasounds or other Referrals:  None Maternal Substance Abuse:  No Significant Maternal Medications:  Meds include: Progesterone Significant Maternal Lab Results:  Lab values include: Group B Strep positive Other Comments:  None  ROS See HPI  Dilation: 2 Effacement (%): 80 Station: -3 Exam by:: Dr. Candyce Churn  Blood pressure 164/99, pulse 72, temperature 98.6 F (37 C), temperature source Oral, resp. rate 20, height 5' 8.5" (1.74 m), weight 124.286 kg (274 lb), SpO2 100 %, unknown if currently breastfeeding. Maternal Exam:  Uterine  Assessment: Contraction strength is moderate.  Contraction duration is 60 seconds. Contraction frequency is regular.   Abdomen: Patient reports no abdominal tenderness. Fetal presentation: vertex  Introitus: Normal vulva. Normal vagina.  Vagina is negative for discharge.  Pelvis: adequate for delivery.   Cervix: Cervix evaluated by digital exam.     Fetal Exam Fetal Monitor Review: Mode: ultrasound.   Baseline rate: 130.  Variability: moderate (6-25 bpm).   Pattern: accelerations present and no decelerations.    Fetal State Assessment: Category I - tracings are normal.     Physical Exam  Nursing note and vitals reviewed. Constitutional: She is oriented to person, place, and time. She appears well-developed and well-nourished. No distress.  HENT:  Head: Normocephalic and atraumatic.  Eyes: Conjunctivae are normal. Right eye exhibits no discharge. Left eye exhibits no discharge.  Cardiovascular: Normal rate.   Respiratory: Effort normal. No respiratory distress.  GI: Soft. She exhibits no distension. There is no tenderness.  Genitourinary: Vagina normal and uterus normal. No vaginal discharge found.  Musculoskeletal: She exhibits no edema.  Neurological: She is alert and oriented to person, place, and time.  Skin: Skin is warm and dry. She is not diaphoretic.  Psychiatric: She has a normal mood and affect. Her behavior is normal.    Prenatal labs: ABO, Rh:  A+ Antibody:  neg Rubella:  immune RPR:   nonreactive HBsAg:   negative HIV:   negative GBS:   positive  Assessment/Plan: Pt presents for labor eval with newly elevated BP to severe range and mild  frontal headache.   Labor Plan #Labor: Early labor progressing normally, will augment if contractions stall on mag or if not progressing well #Preeclampsia: Mag ordered, labetalol prn severe range pressures #Pain: epidural on request #FWB: category 1 #ID: GBS pos - PCN ordered    Beverely Lowdamo, Elena 09/10/2014, 1:17  AM   OB fellow attestation:  I have seen and examined this patient; I agree with above documentation in the resident's note.   Deborah Valentine is a 29 y.o. G2P0010 here for contractions, found to have severely elevated blood pressures and headache.   PE: BP 151/85 mmHg  Pulse 61  Temp(Src) 98.6 F (37 C) (Oral)  Resp 20  Ht 5' 8.5" (1.74 m)  Wt 274 lb (124.286 kg)  BMI 41.05 kg/m2  SpO2 100% Gen: calm comfortable, NAD Resp: normal effort, no distress Abd: gravid  ROS, labs, PMH reviewed  Plan: #Chronic Hypertension with Superimposed Preeclampsia with Severe Features History of chronic hypertension (no medications) and well controlled in pregnancy now with severe headache and severely elevated blood pressure concerning for superimposed preeclampsia.  - Admit  - Magnesium Sulfate for seizure ppx - Labetalol IV prn BP > 160/105 - HELLP labs, UPC pending - Induction of labor as below  #Labor Patient in early labor. Will initiate induction given Superimposed Preeclampsia with Severe Features - Pitocin.  - Desires epidural for pain control.  #GBS Positive - PCN  #FWB - Category I  MOF: breast MOC: considering OCPs, may not be best option given HTN, will discuss more postpartum Circ: n/a, female  William DaltonMcEachern, Catina Nuss 09/10/2014, 2:23 AM

## 2014-09-11 ENCOUNTER — Encounter (HOSPITAL_COMMUNITY): Payer: Self-pay | Admitting: *Deleted

## 2014-09-11 LAB — TYPE AND SCREEN
ABO/RH(D): A POS
ANTIBODY SCREEN: NEGATIVE

## 2014-09-11 LAB — MRSA PCR SCREENING: MRSA by PCR: NEGATIVE

## 2014-09-11 MED ORDER — SODIUM CHLORIDE 0.9 % IJ SOLN: 3.0000 mL | Freq: Two times a day (BID) | INTRAMUSCULAR | Status: DC

## 2014-09-11 MED ORDER — SODIUM CHLORIDE 0.9 % IJ SOLN: 3.0000 mL | INTRAMUSCULAR | Status: DC | PRN

## 2014-09-11 MED ORDER — LACTATED RINGERS IV SOLN
INTRAVENOUS | Status: AC
  Administered 2014-09-11 (×2): via INTRAVENOUS

## 2014-09-11 NOTE — Lactation Note (Signed)
This note was copied from the chart of Deborah Irine SealJennifer Dicostanzo. Lactation Consultation Note  Patient Name: Deborah Valentine [id number]'YToday's Date: 09/11/2014 Reason for consult: Initial assessment Baby 14 hours of life. Mom reports that baby has not been to breast in 5 hours. Enc mom to nurse with cues, but offer STS if baby not cueing after 3 hours. Enc nursing 8-12 times/24 hours. Assisted mom to latch baby to left breast in football position. Baby sleepy and not wanting to latch. Demonstrated to mom how to position baby and how to latch. Mom's nipples have short shafts, so gave mom hand pump with instructions to pre-pump before latching baby. Mom return-demonstrated hand expression with colostrum present. Baby going to bathed shortly, so enc mom to attempt to latch again while giving STS after bath. Mom's breasts are large and pendulous, so enc mom to support breasts throughout BF. Mom given [facility] broch[facility], aware of OP/BFSG, community resources, and [facility] after [facility]. Enc mom to ask for assistance with latching as needed.   Maternal Data Has patient been taught Hand Expression?: Yes Does the patient have breastfeeding experience prior to this delivery?: No  Feeding Feeding Type: Breast Fed Length of feed: 0 min  LATCH Score/Interventions Latch: Too sleepy or reluctant, no latch achieved, no sucking elicited. Intervention(s): Skin to skin;Teach feeding cues;Waking techniques Intervention(s): Adjust position;Assist with latch;Breast compression  Audible Swallowing: None  Type of Nipple: Everted at rest and after stimulation (Short shaft.)  Comfort (Breast/Nipple): Soft / non-tender     Hold (Positioning): Assistance needed to correctly position infant at breast and maintain latch. Intervention(s): Breastfeeding basics reviewed;Support Pillows;Position options;Skin to skin  LATCH Score: 5  Lactation Tools Discussed/Used Tools: Pump Breast pump type: Manual   Consult  Status Consult Status: Follow-up Date: 09/12/14 Follow-up type: In-patient    Geralynn OchsWILLIARD, Tai 09/11/2014, 9:24 AM

## 2014-09-11 NOTE — Anesthesia Postprocedure Evaluation (Signed)
  Anesthesia Post-op Note  Patient: Deborah Valentine  Procedure(s) Performed: * No procedures listed *  Patient Location: PACU and Mother/Baby  Anesthesia Type:Epidural  Level of Consciousness: awake, alert  and oriented  Airway and Oxygen Therapy: Patient Spontanous Breathing  Post-op Pain: mild  Post-op Assessment: Post-op Vital signs reviewed, Patient's Cardiovascular Status Stable, Respiratory Function Stable, No signs of Nausea or vomiting, Adequate PO intake, Pain level controlled, No headache, No backache, No residual numbness and No residual motor weakness  Post-op Vital Signs: Reviewed and stable  Last Vitals:  Filed Vitals:   09/11/14 0800  BP: 136/83  Pulse: 79  Temp:   Resp:     Complications: No apparent anesthesia complications

## 2014-09-11 NOTE — Progress Notes (Signed)
Post Partum Day 1 Subjective: no complaints, up ad lib, voiding and tolerating PO.  No SOB. Lochia normal  Objective: Blood pressure 101/66, pulse 77, temperature 98.3 F (36.8 C), temperature source Oral, resp. rate 18, height 5' 8.5" (1.74 m), weight 267 lb 12.8 oz (121.473 kg), SpO2 100 %, unknown if currently breastfeeding.  Physical Exam:  General: alert, cooperative and no distress Lochia: appropriate Uterine Fundus: firm DVT Evaluation: No evidence of DVT seen on physical exam. Negative Homan's sign. No cords or calf tenderness.   Recent Labs  09/10/14 0054 09/10/14 2050  HGB 11.6* 12.3  HCT 33.8* 36.0    Assessment/Plan: Plan for discharge tomorrow, Breastfeeding and Lactation consult  D/c magnesium at 7pm, then transfer to floor if BP remains stable.   LOS: 1 day   Gerilynn Mccullars JEHIEL 09/11/2014, 6:52 AM

## 2014-09-12 MED ORDER — IBUPROFEN 600 MG PO TABS: 600.0000 mg | ORAL_TABLET | Freq: Four times a day (QID) | ORAL | Status: DC

## 2014-09-12 MED ORDER — NORETHINDRONE 0.35 MG PO TABS
1.0000 | ORAL_TABLET | Freq: Every day | ORAL | Status: DC
Start: 2014-09-12 — End: 2017-09-30

## 2014-09-12 NOTE — Discharge Instructions (Signed)
Follow up with the [facility] in 3 days for a blood pressure check OR have the [facility] nurse check this for you in 3 days.   Postpartum Care After Vaginal Delivery After you deliver your newborn (postpartum period), the usual stay in the hospital is 24-72 hours. If there were problems with your labor or delivery, or if you have other medical problems, you might be in the hospital longer.  While you are in the hospital, you will receive help and instructions on how to care for yourself and your newborn during the postpartum period.  While you are in the hospital:  Be sure to tell your nurses if you have pain or discomfort, as well as where you feel the pain and what makes the pain worse.  If you had an incision made near your vagina (episiotomy) or if you had some tearing during delivery, the nurses may put ice packs on your episiotomy or tear. The ice packs may help to reduce the pain and swelling.  If you are breastfeeding, you may feel uncomfortable contractions of your uterus for a couple of weeks. This is normal. The contractions help your uterus get back to normal size.  It is normal to have some bleeding after delivery.  For the first 1-3 days after delivery, the flow is red and the amount may be similar to a period.  It is common for the flow to start and stop.  In the first few days, you may pass some small clots. Let your nurses know if you begin to pass large clots or your flow increases.  Do not  flush blood clots down the toilet before having the nurse look at them.  During the next 3-10 days after delivery, your flow should become more watery and pink or brown-tinged in color.  Ten to fourteen days after delivery, your flow should be a small amount of yellowish-white discharge.  The amount of your flow will decrease over the first few weeks after delivery. Your flow may stop in 6-8 weeks. Most women have had their flow stop by 12 weeks after delivery.  You should  change your sanitary pads frequently.  Wash your hands thoroughly with soap and water for at least 20 seconds after changing pads, using the toilet, or before holding or feeding your newborn.  You should feel like you need to empty your bladder within the first 6-8 hours after delivery.  In case you become weak, lightheaded, or faint, call your nurse before you get out of bed for the first time and before you take a shower for the first time.  Within the first few days after delivery, your breasts may begin to feel tender and full. This is called engorgement. Breast tenderness usually goes away within 48-72 hours after engorgement occurs. You may also notice milk leaking from your breasts. If you are not breastfeeding, do not stimulate your breasts. Breast stimulation can make your breasts produce more milk.  Spending as much time as possible with your newborn is very important. During this time, you and your newborn can feel close and get to know each other. Having your newborn stay in your room (rooming in) will help to strengthen the bond with your newborn. It will give you time to get to know your newborn and become comfortable caring for your newborn.  Your hormones change after delivery. Sometimes the hormone changes can temporarily cause you to feel sad or tearful. These feelings should not last more than a  few days. If these feelings last longer than that, you should talk to your caregiver.  If desired, talk to your caregiver about methods of family planning or contraception.  Talk to your caregiver about immunizations. Your caregiver may want you to have the following immunizations before leaving the hospital:  Tetanus, diphtheria, and pertussis (Tdap) or tetanus and diphtheria (Td) immunization. It is very important that you and your family (including grandparents) or others caring for your newborn are up-to-date with the Tdap or Td immunizations. The Tdap or Td immunization can help  protect your newborn from getting ill.  Rubella immunization.  Varicella (chickenpox) immunization.  Influenza immunization. You should receive this annual immunization if you did not receive the immunization during your pregnancy. Document Released: 03/14/2007 Document Revised: 02/09/2012 Document Reviewed: 01/12/2012 [facility] Patient Information 2015 [employer] [place]. This information is not intended to replace advice given to you by your health care provider. Make sure you discuss any questions you have with your health care provider.

## 2014-09-12 NOTE — Discharge Summary (Signed)
Obstetric Discharge Summary Reason for Admission: onset of labor(early)/ preeclampsia Prenatal Procedures: ultrasound Intrapartum Procedures: spontaneous vaginal delivery, GBS prophylaxis and Magnesium Postpartum Procedures: none Complications-Operative and Postpartum: vaginal laceration (1st degree) Patient is a 8920w4d at 6220w4d who was admitted in labor with preeclampsia on magnesium  I was gloved and present for delivery in its entirety. Second stage of labor progressed. Prolonged decel to 90s with good variability and accelerations noted in few minutes prior to delivery. Complications: none Lacerations: very small 1st degree sutured with figure of eight  EBL: 250  To AICU for mag x 24h  ACOSTA,KRISTY ROCIO, MD 3:12 PM      Expand All Collapse All   Patient is 29 y.o. G2P0010 3520w4d admitted SOL, hx of preeclampsia   Delivery Note At 7:11 PM a viable female Deborah Valentine(Deborah Valentine) was delivered via Vaginal, Spontaneous Delivery (Presentation: Occiput Anterior). APGAR: 9, 9; weight pending. Placenta status: intact. Cord: 3 vessel with the following complications: none .   Anesthesia: Epidural  Episiotomy: None Lacerations: 1st degree Suture Repair: 3.0 Est. Blood Loss (mL): 250cc  Mom to postpartum. Baby to Couplet care / Skin to Skin.  Raliegh IpGottschalk, Ashly M, DO 09/10/2014, 7:50 PM     Hospital Course:  Active Problems:   Preeclampsia   NSVD (normal spontaneous vaginal delivery)   Deborah SealJennifer Valentine is a 29 y.o. G2P1011 s/p SVD.  Patient was admitted in early labor and kept 2/2 preeclampsia.  She had a postpartum course that was uncomplicated including no problems with ambulating, PO intake, urination, pain, or bleeding. The patient feels ready to go home and will be discharged with outpatient follow-up.   Today: No acute events overnight.  Pt denies problems with ambulating, voiding or po intake.  She denies nausea or vomiting.   Pain is well controlled.  She has had flatus. She has not had bowel movement.  Lochia Small.  Plan for birth control is oral progesterone-only contraceptive.  Method of Feeding: Breast.  She denies headache, vision changes, weakness, SOB.  Physical Exam:  Temp:  [98 F (36.7 C)-98.7 F (37.1 C)] 98 F (36.7 C) (04/14 0357) Pulse Rate:  [63-93] 66 (04/14 0600) Resp:  [18-20] 20 (04/14 0357) BP: (122-158)/(66-92) 136/77 mmHg (04/14 0600) SpO2:  [98 %-100 %] 98 % (04/14 0357)  General: alert, cooperative, appears stated age and no distress  Cardio: RRR, no m/r/g Pulm: CTAB, no increased WOB Abd: NT, +BS Lochia: appropriate Uterine Fundus: firm Ext: WWP, trace edema b/l (nonpitting) DVT Evaluation: No evidence of DVT seen on physical exam. Negative Homan's sign.No cords or calf tenderness. Neuro: no focal deficits, 2/4 patellar DTRs  H/H: Lab Results  Component Value Date/Time   HGB 12.3 09/10/2014 08:50 PM   HCT 36.0 09/10/2014 08:50 PM    Discharge Diagnoses: Term Pregnancy-delivered and Preelampsia  Discharge Information: Date: 09/12/2014 Activity: pelvic rest Diet: routine  Medications: PNV, Ibuprofen and Micronor Breast feeding:  Yes Condition: stable Instructions: refer to handout Discharge to: home      Medication List    STOP taking these medications        progesterone 200 MG capsule  Commonly known as:  PROMETRIUM      TAKE these medications        ibuprofen 600 MG tablet  Commonly known as:  ADVIL,MOTRIN  Take 1 tablet (600 mg total) by mouth every 6 (six) hours.     norethindrone 0.35 MG tablet  Commonly known as:  ORTHO MICRONOR  Take 1 tablet (0.35 mg  total) by mouth daily.     prenatal multivitamin Tabs tablet  Take 1 tablet by mouth daily at 12 noon.       Follow-up Information    Follow up with [facility] [place]. Schedule an appointment as soon as possible for a visit in 4 weeks.   Why:  For your postpartum appointment.    Contact information:   [address] [place] [address] [phone]       Raliegh Ip, DO [facility], PGY-1 09/12/2014,7:51 AM   CNM attestation I have seen and examined this patient and agree with above documentation in the resident's note.   Deborah Valentine is a 29 y.o. G2P1011 s/p SVD w/ mag sulfate PP for preeclampsia.   Pain is well controlled.  Plan for birth control is Micronor.  Method of Feeding: breast  PE:  BP 136/77 mmHg  Pulse 66  Temp(Src) 98 F (36.7 C) (Oral)  Resp 20  Ht 5' 8.5" (1.74 m)  Wt 121.473 kg (267 lb 12.8 oz)  BMI 40.12 kg/m2  SpO2 98%  Breastfeeding? Unknown Fundus firm   Recent Labs  09/10/14 0054 09/10/14 2050  HGB 11.6* 12.3  HCT 33.8* 36.0     Plan: discharge today - postpartum care discussed - f/u clinic in 4 weeks for postpartum visit - will also have Baby Love visit for BP check in 1 weeks   Deborah Valentine, CNM 8:40 AM

## 2014-09-12 NOTE — Lactation Note (Signed)
This note was copied from the chart of Deborah Valentine. Lactation Consultation Note; Mother paged for formula. Observed mother breastfeeding on the right breast. Infant has a shallow latch, but mother states that is not as pinchy. Mother plans to offer infant formula with a cup, spoon after breastfeeeding. Mother was given a supplemental guidelines chart and advised to continue to supplement until her milk comes in. Mother is receptive to all teaching.   Patient Name: Deborah Valentine EAVWU'JToday's Date: 09/12/2014 Reason for consult: Follow-up assessment   Maternal Data    Feeding Feeding Type: Breast Fed  LATCH Score/Interventions Latch: Repeated attempts needed to sustain latch, nipple held in mouth throughout feeding, stimulation needed to elicit sucking reflex. Intervention(s): Skin to skin;Teach feeding cues;Waking techniques Intervention(s): Adjust position;Assist with latch  Audible Swallowing: A few with stimulation  Type of Nipple: Flat Intervention(s): Hand pump  Comfort (Breast/Nipple): Soft / non-tender     Hold (Positioning): Assistance needed to correctly position infant at breast and maintain latch. Intervention(s): Support Pillows;Position options  LATCH Score: 6  Lactation Tools Discussed/Used Tools: Nipple Shields Nipple shield size: 20;24   Consult Status Consult Status: Follow-up Date: 09/12/14 Follow-up type: In-patient    Deborah Valentine, [facility] 09/12/2014, 11:27 AM

## 2014-09-12 NOTE — Lactation Note (Deleted)
This note was copied from the chart of Deborah Irine SealJennifer Badger. Lactation Consultation Note; Mother paged for formula. Observed mother feeding infant on the right breast with a few swallows. Mother taught breast compression. Mother advised to breastfeed before supplementing infant with formula. Advised to post pump breast when using formula. Mother receptive to all teaching. She is aware of available LC services and community support.   Patient Name: Deborah Irine SealJennifer Valentine [id number]'YToday's Date: 09/12/2014 Reason for consult: Follow-up assessment   Maternal Data    Feeding Feeding Type: Breast Fed  LATCH Score/Interventions Latch: Grasps breast easily, tongue down, lips flanged, rhythmical sucking. Intervention(s): Skin to skin;Teach feeding cues;Waking techniques Intervention(s): Adjust position;Assist with latch  Audible Swallowing: A few with stimulation  Type of Nipple: Flat Intervention(s): Hand pump  Comfort (Breast/Nipple): Soft / non-tender     Hold (Positioning): No assistance needed to correctly position infant at breast. Intervention(s): Support Pillows;Position options;Skin to skin  LATCH Score: 8  Lactation Tools Discussed/Used Tools: Nipple Shields Nipple shield size: 20;24   Consult Status Consult Status: Follow-up Date: 09/12/14 Follow-up type: In-patient    [name], [place] [facility] 09/12/2014, 12:01 PM

## 2014-09-12 NOTE — Lactation Note (Signed)
This note was copied from the chart of Deborah Irine SealJennifer Valentine. Lactation Consultation Note: Mother states that she attempt to wake infant 30 mins ago, but infant still sleepy. Mother states the last feeding was 5 hours ago and infant fed for 10 mins. Mother describes that infant is still feeding on and off with a shallow latch. Assist for multiple attempts . Infant pinches the tip of mothers nipple and is unable to get good depth.. Mothers nipples are semi-flat and infant has a tight short frenula. Mother was fit with a #20 and #24 nipple shield. Infant sustained latch for 5 mins. Infant was given .5 of ebm with a curved tip syringe. Mother to post pump with a hand pump for 15 mins on each breast. Mother to page for more assistance. Discussed using formula for supplement if mother is unable to pump or hand express colostrum. Mother states that she plans to purchase an electric pump today. She was advised to feed infant infant 8-12 times in 24 hours and with cues. Discussed limiting use of the pacifier. Mother to breastfeed, supplement and to pump every 2-3 hours. Mother advised in treatment of severe engorgement. Mother informed of available LC services and recommend she follow up for an out patient visit.   Patient Name: Deborah Valentine Reason for consult: Follow-up assessment   Maternal Data    Feeding Feeding Type: Breast Fed  LATCH Score/Interventions Latch: Repeated attempts needed to sustain latch, nipple held in mouth throughout feeding, stimulation needed to elicit sucking reflex. Intervention(s): Skin to skin;Teach feeding cues;Waking techniques Intervention(s): Adjust position;Assist with latch  Audible Swallowing: A few with stimulation  Type of Nipple: Flat Intervention(s): Hand pump  Comfort (Breast/Nipple): Soft / non-tender     Hold (Positioning): Assistance needed to correctly position infant at breast and maintain latch. Intervention(s):  Support Pillows;Position options  LATCH Score: 6  Lactation Tools Discussed/Used Tools: Nipple Shields Nipple shield size: 20;24   Consult Status Consult Status: Follow-up Date: 09/12/14 Follow-up type: In-patient    Deborah Valentine, Deborah Valentine, 10:57 AM

## 2017-09-30 ENCOUNTER — Ambulatory Visit (INDEPENDENT_AMBULATORY_CARE_PROVIDER_SITE_OTHER): Payer: Self-pay

## 2017-09-30 ENCOUNTER — Ambulatory Visit (HOSPITAL_COMMUNITY)
Admission: EM | Admit: 2017-09-30 | Discharge: 2017-09-30 | Disposition: A | Discharge status: Home / Self Care | Payer: BC Managed Care – PPO | Attending: Family Medicine | Admitting: Family Medicine

## 2017-09-30 ENCOUNTER — Encounter (HOSPITAL_COMMUNITY): Payer: Self-pay | Admitting: Emergency Medicine

## 2017-09-30 DIAGNOSIS — M25512 Pain in left shoulder: Secondary | ICD-10-CM | POA: Diagnosis not present

## 2017-09-30 DIAGNOSIS — S161XXA Strain of muscle, fascia and tendon at neck level, initial encounter: Secondary | ICD-10-CM

## 2017-09-30 DIAGNOSIS — M542 Cervicalgia: Secondary | ICD-10-CM

## 2017-09-30 MED ORDER — CYCLOBENZAPRINE HCL 5 MG PO TABS: 5.0000 mg | ORAL_TABLET | Freq: Three times a day (TID) | ORAL | 0 refills | Status: DC | PRN

## 2017-09-30 MED ORDER — IBUPROFEN 800 MG PO TABS: 800.0000 mg | ORAL_TABLET | Freq: Three times a day (TID) | ORAL | 0 refills | Status: DC
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
## 2017-09-30 NOTE — Discharge Instructions (Signed)
Ice to area Gentle stretching Take the ibuprofen with food for pain Take the flexeril as needed for muscle spasm Caution drowsiness Return as needed

## 2019-08-04 ENCOUNTER — Ambulatory Visit: Payer: BC Managed Care – PPO | Attending: Internal Medicine

## 2019-08-04 DIAGNOSIS — Z23 Encounter for immunization: Secondary | ICD-10-CM

## 2019-08-04 NOTE — Progress Notes (Signed)
   Covid-19 Vaccination Clinic  Name:  Deborah Valentine    MRN: [id number] DOB: [birth year]  08/04/2019  Deborah Valentine was observed post Covid-19 immunization for 15 minutes without incident. She was provided with Vaccine Information Sheet and instruction to access the V-Safe system.   Deborah Valentine was instructed to call 911 with any severe reactions post vaccine: Marland Kitchen Difficulty breathing  . Swelling of face and throat  . A fast heartbeat  . A bad rash all over body  . Dizziness and weakness   Immunizations Administered    Name Date Dose VIS Date Route   Pfizer COVID-19 Vaccine 08/04/2019 10:46 AM 0.3 mL 05/11/2019 Intramuscular   Manufacturer: [employer], [employer]   Lot: [id number]   NDC: [address]-2
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
## 2019-08-25 ENCOUNTER — Ambulatory Visit: Payer: BC Managed Care – PPO

## 2019-09-05 ENCOUNTER — Ambulatory Visit: Payer: BC Managed Care – PPO | Attending: Internal Medicine

## 2019-09-05 DIAGNOSIS — Z23 Encounter for immunization: Secondary | ICD-10-CM

## 2019-09-05 NOTE — Progress Notes (Signed)
   Covid-19 Vaccination Clinic  Name:  Donae Kueker    MRN: [id number] DOB: 1986/04/19  09/05/2019  Ms. Junious was observed post Covid-19 immunization for 15 minutes without incident. She was provided with Vaccine Information Sheet and instruction to access the V-Safe system.   Ms. Lecker was instructed to call 911 with any severe reactions post vaccine: Marland Kitchen Difficulty breathing  . Swelling of face and throat  . A fast heartbeat  . A bad rash all over body  . Dizziness and weakness   Immunizations Administered    Name Date Dose VIS Date Route   Pfizer COVID-19 Vaccine 09/05/2019  9:38 AM 0.3 mL 05/11/2019 Intramuscular   Manufacturer: [employer], [employer]   Lot: [id number]   NDC: [address]-5

## 2020-08-19 NOTE — Patient Instructions (Signed)
It was great to meet you!  Our plans for today:  -I would like for you to check your blood pressures daily for the next 1 week.  If you are seeing consistent numbers above 130 systolic, top number, I would like for you to either return or message me those numbers by signing up for mychart.  If you have any questions or concerns about the numbers please let me know.  You can also call into our clinic and leave a message with nursing staff listing the numbers and I can get back to you with any recommendations. -I would like to see you back in about 6 months, at that time we can consider checking an initial cholesterol and diabetes screening which we normally start at age 51 -We will do that blood work we can also consider screening for hepatitis C as is recommended for all adults -If your knee pain does not improve in the next 1-1/2 weeks I would like for you to follow back up with me.  Take care and seek immediate care sooner if you develop any concerns.   Dr. [facility]

## 2020-08-19 NOTE — Progress Notes (Signed)
   Subjective:    Patient ID: Deborah Valentine, female    DOB: [birth year], 35 y.o.   MRN: [id number]   CC: Establish care  HPI:  Deborah Valentine is a very pleasant 35 y.o. female who presents today to establish care.  Initial concerns: Had a fall on roller skates about 1.5 weeks ago. Struck her right knee.  Past medical history: None  Past surgical history: Tonsils removed in 2001  Current medications: None  Family history:Father with DM and HTN  Social history: Exercises with walking and strength training, father is a Development worker, community.   ROS: pertinent noted in the HPI   Objective:  BP (!) 150/90   Pulse 82   Ht 5\' 8"  (1.727 m)   Wt 282 lb 6 oz (128.1 kg)   LMP 08/16/2020   SpO2 99%   BMI 42.93 kg/m   Vitals and nursing note reviewed  General: NAD, pleasant, able to participate in exam HEENT: No pharyngeal erythema, no cervical lymphadenopathy Cardiac: RRR, S1 S2 present. normal heart sounds, no murmurs. Respiratory: CTAB, normal effort Abdomen: Bowel sounds present, non-tender, non-distended, no hepatosplenomegaly Extremities: no edema or cyanosis. MSK: Right knee with minor tenderness to palpation of the patella and lateral joint line with no effusion, no erythema.  No pain to palpation with patellar tendon, quadriceps tendon.  Negative valgus and varus stress testing.  Negative anterior and posterior drawer testing. Skin: warm and dry, no rashes noted Neuro: alert, no obvious focal deficits Psych: Normal affect and mood   Assessment & Plan:   Elevated blood pressure: Blood pressure little elevated in clinic today, however patient was rushed to get here and did have extra coffee this morning.  She does have a father is a physician and is able to check her blood pressures at home.  She has never noticed pressures above 120s in the past.  Patient plans to check her pressure daily for the next 1 week and let me know if she is seeing numbers consistently above 130.    Right knee pain: Occurred after a fall while on roller skates.  Patient states it has been getting better.  On physical exam she has no effusion and I does have a mild amount of tenderness to palpation of the patella, however this is minimal and I discussed with her that I did not believe she needed an x-ray at this time which she was in agreement with.  She will continue to monitor it and if it does not continue to improve over the next 1.5 weeks, about 3 weeks since it occurred, then she will return.  I did recommend conservative treatment including ice, heat, and continuing with normal exercise.  Patient states she had a Pap smear in 2020 and had good results.  She plans to follow-up in about 6 months at which time we can get a baseline lipid panel and A1c screening for diabetes.  2021, DO [facility] PGY-1

## 2020-08-20 ENCOUNTER — Encounter: Payer: Self-pay | Admitting: Family Medicine

## 2020-08-20 ENCOUNTER — Ambulatory Visit: Payer: BC Managed Care – PPO | Admitting: Family Medicine

## 2020-08-20 ENCOUNTER — Other Ambulatory Visit: Payer: Self-pay

## 2020-08-20 VITALS — BP 150/90 | HR 82 | Ht 68.0 in | Wt 282.4 lb

## 2020-08-20 DIAGNOSIS — R030 Elevated blood-pressure reading, without diagnosis of hypertension: Secondary | ICD-10-CM

## 2020-08-20 DIAGNOSIS — M25561 Pain in right knee: Secondary | ICD-10-CM | POA: Diagnosis not present

## 2020-08-20 DIAGNOSIS — Z7689 Persons encountering health services in other specified circumstances: Secondary | ICD-10-CM | POA: Diagnosis not present

## 2020-08-20 NOTE — Addendum Note (Signed)
Addended by: Jackelyn Poling on: 08/20/2020 09:17 AM   Modules accepted: Orders

## 2022-04-08 ENCOUNTER — Encounter (HOSPITAL_COMMUNITY): Payer: Self-pay

## 2022-04-08 ENCOUNTER — Ambulatory Visit (INDEPENDENT_AMBULATORY_CARE_PROVIDER_SITE_OTHER): Payer: BC Managed Care – PPO

## 2022-04-08 ENCOUNTER — Ambulatory Visit (HOSPITAL_COMMUNITY)
Admission: EM | Admit: 2022-04-08 | Discharge: 2022-04-08 | Disposition: A | Discharge status: Home / Self Care | Payer: BC Managed Care – PPO | Attending: Family Medicine | Admitting: Family Medicine

## 2022-04-08 DIAGNOSIS — R062 Wheezing: Secondary | ICD-10-CM

## 2022-04-08 DIAGNOSIS — R053 Chronic cough: Secondary | ICD-10-CM

## 2022-04-08 DIAGNOSIS — R0602 Shortness of breath: Secondary | ICD-10-CM | POA: Diagnosis not present

## 2022-04-08 MED ORDER — AZITHROMYCIN 250 MG PO TABS: 250.0000 mg | ORAL_TABLET | Freq: Every day | ORAL | 0 refills | Status: DC

## 2022-04-08 MED ORDER — PREDNISONE 20 MG PO TABS: 40.0000 mg | ORAL_TABLET | Freq: Every day | ORAL | 0 refills | Status: DC

## 2022-04-08 NOTE — ED Triage Notes (Signed)
Pt is here for cough, chest congestion, fatigue, SOB, and wheezing x month causing some discomfort.

## 2022-04-08 NOTE — ED Provider Notes (Signed)
[facility]   [id number] 04/08/22 Arrival Time: 0904  ASSESSMENT & PLAN:  1. Persistent cough for 3 weeks or longer   2. Wheezing    I have personally viewed the imaging studies ordered this visit. No acute changes on CXR. No PNA.  Given duration of symptoms and with wheezing, will have begin: Discharge Medication List as of 04/08/2022 12:10 PM     START taking these medications   Details  azithromycin (ZITHROMAX) 250 MG tablet Take 1 tablet (250 mg total) by mouth daily. Take first 2 tablets together, then 1 every day until finished., Starting Thu 04/08/2022, Normal    predniSONE (DELTASONE) 20 MG tablet Take 2 tablets (40 mg total) by mouth daily., Starting Thu 04/08/2022, Normal      OTC symptom care as needed.   Follow-up Information     Glendale Chard, DO.   Specialty: Family Medicine Why: If worsening or failing to improve as anticipated. Contact information: [address] [place] [address] [phone]                 Reviewed expectations re: course of current medical issues. Questions answered. Outlined signs and symptoms indicating need for more acute intervention. Understanding verbalized. After Visit Summary given.   SUBJECTIVE: History from: Patient. Deborah Valentine is a 36 y.o. female. Pt is here for cough, chest congestion, fatigue, SOB, and wheezing x one month causing some discomfort. Ques wheezing at times. No specific CP reported. Normal PO intake without n/v/d. No h/o asthma.  Social History   Tobacco Use  Smoking Status Never  Smokeless Tobacco Not on file     OBJECTIVE:  Vitals:   04/08/22 0956  BP: (!) 148/72  Pulse: 77  Resp: 12  Temp: 98.9 F (37.2 C)  TempSrc: Oral  SpO2: 100%    General appearance: alert; no distress Eyes: PERRLA; EOMI; conjunctiva normal HENT: [place]; AT; without significant nasal congestion Neck: supple  Lungs: speaks full sentences without difficulty; unlabored; bilat exp  wheezing Extremities: no edema Skin: warm and dry Neurologic: normal gait Psychological: alert and cooperative; normal mood and affect  Imaging: DG Chest 2 View  Result Date: 04/08/2022 CLINICAL DATA:  Shortness of breath. EXAM: CHEST - 2 VIEW COMPARISON:  None Available. FINDINGS: The heart size and mediastinal contours are within normal limits. Both lungs are clear. The visualized skeletal structures are unremarkable. IMPRESSION: No active cardiopulmonary disease. Electronically Signed   By: Lupita Raider M.D.   On: 04/08/2022 10:42    No Known Allergies  Past Medical History:  Diagnosis Date   Medical history non-contributory    Social History   Socioeconomic History   Marital status: Married    Spouse name: Not on file   Number of children: Not on file   Years of education: Not on file   Highest education level: Not on file  Occupational History   Not on file  Tobacco Use   Smoking status: Never   Smokeless tobacco: Not on file  Substance and Sexual Activity   Alcohol use: No    Comment: Occasssional Use befor pregnancy   Drug use: No   Sexual activity: Yes  Other Topics Concern   Not on file  Social History Narrative   Not on file   Social Determinants of Health   Financial Resource Strain: Not on file  Food Insecurity: Not on file  Transportation Needs: Not on file  Physical Activity: Not on file  Stress: Not on file  Social Connections: Not on file  Intimate Partner Violence: Not on file   Family History  Problem Relation Age of Onset   High blood pressure Father    Diabetes Father    Past Surgical History:  Procedure Laterality Date   TONSILLECTOMY       Mardella Layman, MD 04/08/22 [phone]

## 2023-11-14 ENCOUNTER — Ambulatory Visit
Admission: RE | Admit: 2023-11-14 | Discharge: 2023-11-14 | Disposition: A | Discharge status: Home / Self Care | Source: Ambulatory Visit | Attending: Family Medicine | Admitting: Family Medicine

## 2023-11-14 ENCOUNTER — Telehealth: Payer: Self-pay

## 2023-11-14 VITALS — BP 168/89 | HR 88 | Temp 98.1°F | Resp 17

## 2023-11-14 DIAGNOSIS — R030 Elevated blood-pressure reading, without diagnosis of hypertension: Secondary | ICD-10-CM | POA: Diagnosis not present

## 2023-11-14 DIAGNOSIS — G44209 Tension-type headache, unspecified, not intractable: Secondary | ICD-10-CM | POA: Diagnosis not present

## 2023-11-14 MED ORDER — TIZANIDINE HCL 4 MG PO TABS: 4.0000 mg | ORAL_TABLET | Freq: Every day | ORAL | 0 refills | Status: AC

## 2023-11-14 MED ORDER — NAPROXEN SODIUM 550 MG PO TABS: 550.0000 mg | ORAL_TABLET | Freq: Two times a day (BID) | ORAL | 0 refills | Status: AC

## 2023-11-14 NOTE — Telephone Encounter (Signed)
 Pt called stating she called pharmacy and was told her rx was never received. Call made to pharmacy and was told they are ready for p/u. Pt notified.

## 2023-11-14 NOTE — ED Triage Notes (Signed)
 Pt c/o dull HA x 3-4 days. Aspirin prn with no relief.

## 2023-11-14 NOTE — ED Provider Notes (Signed)
 [facility]    CSN: [id number] Arrival date & time: 11/14/23  1153      History   Chief Complaint Chief Complaint  Patient presents with   Headache    HPI Deborah Valentine is a 38 y.o. female.   Patient is here for headache.  She has had a headache for the last few days.  It has gone away with her usual treatment.  She has not had any sinus symptoms or cold.  She has not had any trauma.  She has not had any visual changes hearing changes or neurochanges.  She has no history of headaches or migraines in the past. Her initial blood pressure was elevated.  I repeated her blood pressure after sitting for a few minutes and it came down to 148/90.  I explained that she needs to have a blood pressure resting for 5 minutes with her feet on the ground, and that 1 artificial elevated reading is nothing to worry about    Past Medical History:  Diagnosis Date   Medical history non-contributory     Patient Active Problem List   Diagnosis Date Noted   Elevated blood pressure reading in office without diagnosis of hypertension 11/14/2023    Past Surgical History:  Procedure Laterality Date   TONSILLECTOMY      OB History     Gravida  2   Para  1   Term  1   Preterm  0   AB  1   Living  1      SAB  1   IAB  0   Ectopic  0   Multiple  0   Live Births  1            Home Medications    Prior to Admission medications   Medication Sig Start Date End Date Taking? Authorizing Provider  naproxen sodium (ANAPROX DS) 550 MG tablet Take 1 tablet (550 mg total) by mouth 2 (two) times daily with a meal. 11/14/23  Yes Stephany Ehrich, MD  tiZANidine (ZANAFLEX) 4 MG tablet Take 1-2 tablets (4-8 mg total) by mouth at bedtime. 11/14/23  Yes Stephany Ehrich, MD    Family History Family History  Problem Relation Age of Onset   High blood pressure Father    Diabetes Father     Social History Social History   Tobacco Use   Smoking status: Never   Substance Use Topics   Alcohol use: No    Comment: Occasssional Use befor pregnancy   Drug use: No     Allergies   Patient has no known allergies.   Review of Systems Review of Systems  See HPI Physical Exam Triage Vital Signs ED Triage Vitals [11/14/23 1201]  Encounter Vitals Group     BP (!) 168/89     Girls Systolic BP Percentile      Girls Diastolic BP Percentile      Boys Systolic BP Percentile      Boys Diastolic BP Percentile      Pulse Rate 88     Resp 17     Temp 98.1 F (36.7 C)     Temp Source Oral     SpO2 98 %     Weight      Height      Head Circumference      Peak Flow      Pain Score 3     Pain Loc      Pain Education  Exclude from Growth Chart    No data found.  Updated Vital Signs BP (!) 168/89 (BP Location: Right Arm)   Pulse 88   Temp 98.1 F (36.7 C) (Oral)   Resp 17   LMP 11/10/2023 (Approximate)   SpO2 98%   Breastfeeding No      Physical Exam Constitutional:      General: She is not in acute distress.    Appearance: She is well-developed.     Comments: Overweight  HENT:     Head: Normocephalic and atraumatic.     Mouth/Throat:     Mouth: Mucous membranes are moist.   Eyes:     General: No visual field deficit.    Extraocular Movements: Extraocular movements intact.     Right eye: Normal extraocular motion and no nystagmus.     Left eye: Normal extraocular motion and no nystagmus.     Conjunctiva/sclera: Conjunctivae normal.     Pupils: Pupils are equal, round, and reactive to light.     Right eye: Pupil is round and reactive.     Left eye: Pupil is round and reactive.     Comments: Discs are flat  Neck:     Comments: Full range of motion of neck.  Mild tenderness in the upper body of the trapezius and paraspinous cervical muscles bilaterally. Cardiovascular:     Rate and Rhythm: Normal rate.  Pulmonary:     Effort: Pulmonary effort is normal. No respiratory distress.  Abdominal:     General: There is no  distension.     Palpations: Abdomen is soft.   Musculoskeletal:        General: Normal range of motion.     Cervical back: Normal range of motion.   Skin:    General: Skin is warm and dry.   Neurological:     Mental Status: She is alert and oriented to person, place, and time.     Cranial Nerves: No cranial nerve deficit, dysarthria or facial asymmetry.     Gait: Gait normal.     Deep Tendon Reflexes: Reflexes normal.   Psychiatric:        Mood and Affect: Mood normal.        Behavior: Behavior normal.      UC Treatments / Results  Labs (all labs ordered are listed, but only abnormal results are displayed) Labs Reviewed - No data to display  EKG   Radiology No results found.  Procedures Procedures (including critical care time)  Medications Ordered in UC Medications - No data to display  Initial Impression / Assessment and Plan / UC Course  I have reviewed the triage vital signs and the nursing notes.  Pertinent labs & imaging results that were available during my care of the patient were reviewed by me and considered in my medical decision making (see chart for details).     Final Clinical Impressions(s) / UC Diagnoses   Final diagnoses:  Acute non intractable tension-type headache  Elevated blood pressure reading in office without diagnosis of hypertension     Discharge Instructions      Take naproxen 2 times a day till headache is gone.  After this you may take as needed Take tizanidine 2 pills at bedtime.  Take until headache is improved See your doctor for follow-up    ED Prescriptions     Medication Sig Dispense Auth. Provider   naproxen sodium (ANAPROX DS) 550 MG tablet Take 1 tablet (550 mg total) by mouth 2 (  two) times daily with a meal. 30 tablet Stephany Ehrich, MD   tiZANidine (ZANAFLEX) 4 MG tablet Take 1-2 tablets (4-8 mg total) by mouth at bedtime. 21 tablet Stephany Ehrich, MD      PDMP not reviewed this encounter.    Stephany Ehrich, MD 11/14/23 1235

## 2023-11-14 NOTE — Discharge Instructions (Signed)
 Take naproxen 2 times a day till headache is gone.  After this you may take as needed Take tizanidine 2 pills at bedtime.  Take until headache is improved See your doctor for follow-up
